# Patient Record
Sex: Female | Born: 1955 | Race: White | Hispanic: No | Marital: Married | Smoking: Never smoker
Health system: Southern US, Community
[De-identification: ages and names within clinical notes are randomized; demographics above are authoritative.]

## PROBLEM LIST (undated history)

## (undated) DIAGNOSIS — Z9889 Other specified postprocedural states: Secondary | ICD-10-CM

## (undated) DIAGNOSIS — I10 Essential (primary) hypertension: Secondary | ICD-10-CM

## (undated) DIAGNOSIS — D649 Anemia, unspecified: Secondary | ICD-10-CM

## (undated) DIAGNOSIS — R112 Nausea with vomiting, unspecified: Secondary | ICD-10-CM

## (undated) DIAGNOSIS — R569 Unspecified convulsions: Secondary | ICD-10-CM

## (undated) DIAGNOSIS — K219 Gastro-esophageal reflux disease without esophagitis: Secondary | ICD-10-CM

## (undated) DIAGNOSIS — E119 Type 2 diabetes mellitus without complications: Secondary | ICD-10-CM

## (undated) HISTORY — PX: CHOLECYSTECTOMY: SHX55

## (undated) HISTORY — PX: TUBAL LIGATION: SHX77

## (undated) HISTORY — PX: BILATERAL CARPAL TUNNEL RELEASE: SHX6508

---

## 2005-07-23 ENCOUNTER — Emergency Department (HOSPITAL_COMMUNITY): Admission: EM | Admit: 2005-07-23 | Discharge: 2005-07-23 | Payer: Self-pay | Admitting: Family Medicine

## 2008-08-17 ENCOUNTER — Emergency Department (HOSPITAL_COMMUNITY): Admission: EM | Admit: 2008-08-17 | Discharge: 2008-08-17 | Payer: Self-pay | Admitting: Emergency Medicine

## 2013-04-01 DIAGNOSIS — Z8614 Personal history of Methicillin resistant Staphylococcus aureus infection: Secondary | ICD-10-CM

## 2013-04-01 DIAGNOSIS — K589 Irritable bowel syndrome without diarrhea: Secondary | ICD-10-CM

## 2013-04-01 DIAGNOSIS — D696 Thrombocytopenia, unspecified: Secondary | ICD-10-CM

## 2013-04-01 DIAGNOSIS — K922 Gastrointestinal hemorrhage, unspecified: Secondary | ICD-10-CM | POA: Insufficient documentation

## 2013-04-01 DIAGNOSIS — K769 Liver disease, unspecified: Secondary | ICD-10-CM

## 2013-04-01 DIAGNOSIS — E119 Type 2 diabetes mellitus without complications: Secondary | ICD-10-CM

## 2013-04-01 DIAGNOSIS — I10 Essential (primary) hypertension: Secondary | ICD-10-CM

## 2013-04-01 HISTORY — DX: Liver disease, unspecified: K76.9

## 2013-04-01 HISTORY — DX: Irritable bowel syndrome without diarrhea: K58.9

## 2013-04-01 HISTORY — DX: Essential (primary) hypertension: I10

## 2013-04-01 HISTORY — DX: Thrombocytopenia, unspecified: D69.6

## 2013-04-01 HISTORY — DX: Type 2 diabetes mellitus without complications: E11.9

## 2013-04-01 HISTORY — DX: Personal history of Methicillin resistant Staphylococcus aureus infection: Z86.14

## 2013-04-01 HISTORY — DX: Gastrointestinal hemorrhage, unspecified: K92.2

## 2013-04-03 DIAGNOSIS — K279 Peptic ulcer, site unspecified, unspecified as acute or chronic, without hemorrhage or perforation: Secondary | ICD-10-CM

## 2013-04-03 HISTORY — DX: Peptic ulcer, site unspecified, unspecified as acute or chronic, without hemorrhage or perforation: K27.9

## 2014-02-19 DIAGNOSIS — Z1331 Encounter for screening for depression: Secondary | ICD-10-CM

## 2014-02-19 HISTORY — DX: Encounter for screening for depression: Z13.31

## 2014-11-19 DIAGNOSIS — Z5320 Procedure and treatment not carried out because of patient's decision for unspecified reasons: Secondary | ICD-10-CM

## 2014-11-19 DIAGNOSIS — Z2821 Immunization not carried out because of patient refusal: Secondary | ICD-10-CM | POA: Insufficient documentation

## 2014-11-19 HISTORY — DX: Immunization not carried out because of patient refusal: Z28.21

## 2014-11-19 HISTORY — DX: Procedure and treatment not carried out because of patient's decision for unspecified reasons: Z53.20

## 2016-03-24 DIAGNOSIS — E6609 Other obesity due to excess calories: Secondary | ICD-10-CM

## 2016-03-24 HISTORY — DX: Other obesity due to excess calories: E66.09

## 2016-09-23 DIAGNOSIS — D693 Immune thrombocytopenic purpura: Secondary | ICD-10-CM

## 2016-09-23 DIAGNOSIS — R161 Splenomegaly, not elsewhere classified: Secondary | ICD-10-CM

## 2016-09-23 DIAGNOSIS — IMO0001 Reserved for inherently not codable concepts without codable children: Secondary | ICD-10-CM

## 2016-09-23 DIAGNOSIS — N841 Polyp of cervix uteri: Secondary | ICD-10-CM

## 2016-09-23 HISTORY — DX: Splenomegaly, not elsewhere classified: R16.1

## 2016-09-23 HISTORY — DX: Polyp of cervix uteri: N84.1

## 2016-09-23 HISTORY — DX: Immune thrombocytopenic purpura: D69.3

## 2016-09-23 HISTORY — DX: Reserved for inherently not codable concepts without codable children: IMO0001

## 2016-10-04 NOTE — H&P (Signed)
Tracy Duke is a 60 y.o. female here for Pre Op Consulting (sign consents) . Patient is here today to sign consents for D&C, hysteroscopy, polypectomy for post-menopausal bleeding  Patient saw hematology on 09/23/2016 with a platelet count of 94. Labs in chart. Workup ongoing with them; she has a hepatic ultrasound scheduled. And will plan to eval spleen.   Past Medical History:  has a past medical history of Diabetes mellitus type 2, uncomplicated (CMS-HCC).  Past Surgical History:  has a past surgical history that includes Cholecystectomy and Toenail excision. Family History: family history includes Breast cancer in her mother; Diabetes mellitus in her paternal grandfather. Social History:  reports that she has never smoked. She has never used smokeless tobacco. She reports that she does not drink alcohol or use illicit drugs. OB/GYN History:  OB History    Gravida Para Term Preterm AB Living   2 2 2   2    SAB TAB Ectopic Multiple Live Births       2      Allergies: is allergic to codeine and penicillins. Medications:  Current Outpatient Prescriptions:  .  blood glucose diagnostic test strip, , Disp: , Rfl:  .  evening primrose oil 500 mg Cap, Take 500 mg by mouth nightly.  , Disp: , Rfl:  .  fluticasone (FLONASE) 50 mcg/actuation nasal spray, Place 2 sprays into both nostrils once daily., Disp: , Rfl:  .  glipiZIDE (GLUCOTROL XL) 5 MG XL tablet, Take 10 mg by mouth once daily.  , Disp: , Rfl: 4 .  LANTUS SOLOSTAR pen injector (concentration 100 units/mL), Inject 55 Units subcutaneously nightly.  , Disp: , Rfl: 10 .  lisinopril (PRINIVIL,ZESTRIL) 20 MG tablet, Take 20 mg by mouth once daily.  , Disp: , Rfl: 10 .  loratadine (CLARITIN) 10 mg capsule, Take 10 mg by mouth once daily., Disp: , Rfl:  .  multivitamin-lutein (MULTIVITAMIN 50 PLUS) tablet, Take 1 tablet by mouth once daily., Disp: , Rfl:  .  pen needle, diabetic 31 gauge x 3/16" needle, , Disp: , Rfl:  .   sitaGLIPtin (JANUVIA) 100 MG tablet, Take 100 mg by mouth once daily.  , Disp: , Rfl:   Review of Systems: No SOB, no palpitations or chest pain, no new lower extremity edema, no nausea or vomiting or bowel or bladder complaints. See HPI for gyn specific ROS.   Exam:      Vitals:   09/28/16 0937  BP: (P) 135/66  Pulse: (P) 81    WDWN white female in NAD Body mass index is 35.09 kg/(m^2) (pended).  General: Patient is well-groomed, well-nourished, appears stated age in no acute distress   Impression:   The primary encounter diagnosis was Post-menopausal bleeding. Diagnoses of Endometrial polyp, Idiopathic thrombocytopenia (CMS-HCC), and Uncontrolled type 2 diabetes mellitus without complication, with long-term current use of insulin (CMS-HCC) were also pertinent to this visit.    Plan:   -  Preoperative visit: D&C hysteroscopy, and polypectomy. Consents signed and dated today.   - Tracy Duke was seen today for pre op consulting.  Diagnoses and all orders for this visit:  Post-menopausal bleeding  Endometrial polyp  Idiopathic thrombocytopenia (CMS-HCC) - following with hematology - no hx of significant bleeding with ob procedures in past - plts high at 94K - will proceed with surgery  Uncontrolled type 2 diabetes mellitus without complication, with long-term current use of insulin (CMS-HCC) - recommended sugar control prior to and following procedure   Risks of surgery  were discussed with the patient including but not limited to: bleeding which may require transfusion; infection which may require antibiotics; injury to uterus or surrounding organs; intrauterine scarring which may impair future fertility; need for additional procedures including laparotomy or laparoscopy; and other postoperative/anesthesia complications. Written informed consent was obtained.  This is a scheduled same-day surgery. She will have a postop visit in 2 weeks to review  operative findings and pathology.  -  Return in about 4 weeks (around 10/26/2016) for Postop check.  Cecilie KicksBETHANY EVANGELINE Omran Keelin, MD

## 2016-10-07 ENCOUNTER — Encounter
Admission: RE | Admit: 2016-10-07 | Discharge: 2016-10-07 | Disposition: A | Discharge status: Home / Self Care | Payer: BLUE CROSS/BLUE SHIELD | Source: Ambulatory Visit | Attending: Obstetrics and Gynecology | Admitting: Obstetrics and Gynecology

## 2016-10-07 DIAGNOSIS — Z01818 Encounter for other preprocedural examination: Secondary | ICD-10-CM | POA: Insufficient documentation

## 2016-10-07 HISTORY — DX: Essential (primary) hypertension: I10

## 2016-10-07 HISTORY — DX: Gastro-esophageal reflux disease without esophagitis: K21.9

## 2016-10-07 HISTORY — DX: Anemia, unspecified: D64.9

## 2016-10-07 HISTORY — DX: Unspecified convulsions: R56.9

## 2016-10-07 HISTORY — DX: Type 2 diabetes mellitus without complications: E11.9

## 2016-10-07 HISTORY — DX: Other specified postprocedural states: Z98.890

## 2016-10-07 HISTORY — DX: Nausea with vomiting, unspecified: R11.2

## 2016-10-07 LAB — CBC
HCT: 39.7 % (ref 35.0–47.0)
Hemoglobin: 13.9 g/dL (ref 12.0–16.0)
MCH: 31.8 pg (ref 26.0–34.0)
MCHC: 35.1 g/dL (ref 32.0–36.0)
MCV: 90.6 fL (ref 80.0–100.0)
PLATELETS: 76 10*3/uL — AB (ref 150–440)
RBC: 4.38 MIL/uL (ref 3.80–5.20)
RDW: 13.3 % (ref 11.5–14.5)
WBC: 3.9 10*3/uL (ref 3.6–11.0)

## 2016-10-07 LAB — TYPE AND SCREEN
ABO/RH(D): O POS
ANTIBODY SCREEN: NEGATIVE

## 2016-10-07 LAB — BASIC METABOLIC PANEL
Anion gap: 7 (ref 5–15)
BUN: 20 mg/dL (ref 6–20)
CALCIUM: 9.3 mg/dL (ref 8.9–10.3)
CO2: 27 mmol/L (ref 22–32)
CREATININE: 0.86 mg/dL (ref 0.44–1.00)
Chloride: 104 mmol/L (ref 101–111)
GLUCOSE: 301 mg/dL — AB (ref 65–99)
Potassium: 4.3 mmol/L (ref 3.5–5.1)
Sodium: 138 mmol/L (ref 135–145)

## 2016-10-07 LAB — SURGICAL PCR SCREEN
MRSA, PCR: NEGATIVE
Staphylococcus aureus: NEGATIVE

## 2016-10-07 NOTE — Patient Instructions (Signed)
  Your procedure is scheduled on:October 15, 2016 (Friday) Report to [facility] Day [facility]nd [facility] To find out your arrival time please call [phone] between 1PM - 3PM on October 14, 2016 (Thursday)  Remember: Instructions that are not followed completely may result in serious medical risk, up to and including death, or upon the discretion of your surgeon and anesthesiologist your surgery may need to be rescheduled.    _x___ 1. Do not eat food or drink liquids after midnight. No gum chewing or hard candies.     __x__ 2. No Alcohol for 24 hours before or after surgery.   __x__3. No Smoking for 24 prior to surgery.   ____  4. Bring all medications with you on the day of surgery if instructed.    __x__ 5. Notify your doctor if there is any change in your medical condition     (cold, fever, infections).     Do not wear jewelry, make-up, hairpins, clips or nail polish.  Do not wear lotions, powders, or perfumes. You may wear deodorant.  Do not shave 48 hours prior to surgery. Men may shave face and neck.  Do not bring valuables to the hospital.    [facility] is not responsible for any belongings or valuables.               Contacts, dentures or bridgework may not be worn into surgery.  Leave your suitcase in the car. After surgery it may be brought to your room.  For patients admitted to the hospital, discharge time is determined by your treatment team.   Patients discharged the day of surgery will not be allowed to drive home.    Please read over the following fact sheets that you were given:   [facility] Preparing for Surgery and or MRSA Information   _x___ Take these medicines the morning of surgery with A SIP OF WATER:    1. Lisinopril  2.  3.  4.  5.  6.  ____Fleets enema or Magnesium Citrate as directed.   _x___ Use CHG Soap or sage wipes as directed on instruction sheet   ____ Use inhalers on the day of surgery and bring to hospital day of  surgery  ____ Stop metformin 2 days prior to surgery    __x__ Take 1/2 of usual insulin dose the night before surgery and none on the morning of surgery.   (TAKE ONE-HALF OF LANTUS ON THURSDAY NIGHT   PRIOR TO SURGERY, AND NO INSULIN THE MORNING OF SURGERY  __x__ Stop aspirin or coumadin, or plavix (NO ASPIRIN)  x__ Stop Anti-inflammatories such as Advil, Aleve, Ibuprofen, Motrin, Naproxen,          Naprosyn, Goodies powders or aspirin products. Ok to take Tylenol.   _x___ Stop supplements until after surgery.  (Stop Glucosamine, Alive, and Primrose now)  ____ Bring C-Pap to the hospital.

## 2016-10-12 NOTE — H&P (Signed)
Ms.Crosbyis a 60 y.o.femalehere for Pre Op Consulting (sign consents) . Patient is here today to sign consents for D&C, hysteroscopy, polypectomy for post-menopausal bleeding  Patient saw hematology on 09/23/2016 with a platelet count of 94. Labs in chart. Workup ongoing with them; she has a hepatic ultrasound scheduled. And will plan to eval spleen.   Past Medical History:has a past medical history of Diabetes mellitus type 2, uncomplicated (CMS-HCC). Past Surgical History:has a past surgical history that includes Cholecystectomy and Toenail excision. Family History:family history includes Breast cancer in her mother; Diabetes mellitus in her paternal grandfather. Social History:reports that she has never smoked. She has never used smokeless tobacco. She reports that she does not drink alcohol or use illicit drugs. OB/GYN History:         OB History   Gravida Para Term Preterm AB Living   2 2 2   2    SAB TAB Ectopic Multiple Live Births       2      Allergies:is allergic to codeine and penicillins. Medications:  Current Outpatient Prescriptions:  .blood glucose diagnostic test strip, , Disp: , Rfl:  .evening primrose oil 500 mg Cap, Take 500 mg by mouth nightly. , Disp: , Rfl:  .fluticasone (FLONASE) 50 mcg/actuation nasal spray, Place 2 sprays into both nostrils once daily., Disp: , Rfl:  .glipiZIDE (GLUCOTROL XL) 5 MG XL tablet, Take 10 mg by mouth once daily. , Disp: , Rfl: 4 .LANTUS SOLOSTAR pen injector (concentration 100 units/mL), Inject 55 Units subcutaneously nightly. , Disp: , Rfl: 10 .lisinopril (PRINIVIL,ZESTRIL) 20 MG tablet, Take 20 mg by mouth once daily. , Disp: , Rfl: 10 .loratadine (CLARITIN) 10 mg capsule, Take 10 mg by mouth once daily., Disp: , Rfl:  .multivitamin-lutein (MULTIVITAMIN 50 PLUS) tablet, Take 1 tablet by mouth once daily., Disp: , Rfl:  .pen needle, diabetic 31 gauge x 3/16" needle, , Disp: ,  Rfl:  .sitaGLIPtin (JANUVIA) 100 MG tablet, Take 100 mg by mouth once daily. , Disp: , Rfl:   Review of Systems: No SOB, no palpitations or chest pain, no new lower extremity edema, no nausea or vomiting or bowel or bladder complaints. See HPI for gyn specific ROS.  Exam:      Vitals:   09/28/16 0937  BP: (P) 135/66  Pulse: (P) 81    WDWN whitefemale in NADBody mass index is 35.09 kg/(m^2) (pended).  General: Patient is well-groomed, well-nourished, appears stated age in no acute distress  HEENT: head is atraumatic and normocephalic, trachea is midline, neck is supple with no palpable nodules  CV: Regular rhythm and normal heart rate, no murmur  Pulm: Clear to auscultation throughout lung fields with no wheezing, crackles, or rhonchi. No increased work of breathing  Abdomen: soft , no mass, non-tender, no rebound tenderness, no hepatomegaly  Pelvic:  Deferred this visit   Impression:   The primary encounter diagnosis was Post-menopausal bleeding. Diagnoses of Endometrial polyp, Idiopathic thrombocytopenia (CMS-HCC), and Uncontrolled type 2 diabetes mellitus without complication, with long-term current use of insulin (CMS-HCC) were also pertinent to this visit.    Plan:   -Preoperative visit: D&C hysteroscopy, and polypectomy. Consents signed and datedtoday.   - Tracy Duke was seen today for pre op consulting.  Diagnoses and all orders for this visit:  Post-menopausal bleeding  Endometrial polyp  Idiopathic thrombocytopenia (CMS-HCC) - following with hematology - no hx of significant bleeding with ob procedures in past - plts high at 94K - will proceed with surgery  Uncontrolled type  2 diabetes mellitus without complication, with long-term current use of insulin (CMS-HCC) - recommended sugar control prior to and following procedure   Risks of surgery were discussed with the patient including but not limited to: bleeding  which may require transfusion; infection which may require antibiotics; injury to uterus or surrounding organs; intrauterine scarring which may impair future fertility; need for additional procedures including laparotomy or laparoscopy; and other postoperative/anesthesia complications. Written informed consent was obtained.  This is a scheduled same-day surgery. She will have a postop visit in 2 weeks to review operative findings and pathology.  - Return in about 4 weeks (around 10/26/2016) for Postop check.  Tracy KicksBETHANY EVANGELINE Trevis Eden, MD

## 2016-10-15 ENCOUNTER — Encounter
Admission: RE | Disposition: A | Discharge status: Home / Self Care | Payer: Self-pay | Source: Ambulatory Visit | Attending: Obstetrics and Gynecology

## 2016-10-15 ENCOUNTER — Ambulatory Visit: Payer: BLUE CROSS/BLUE SHIELD | Admitting: Anesthesiology

## 2016-10-15 ENCOUNTER — Ambulatory Visit
Admission: RE | Admit: 2016-10-15 | Discharge: 2016-10-15 | Disposition: A | Discharge status: Home / Self Care | Payer: BLUE CROSS/BLUE SHIELD | Source: Ambulatory Visit | Attending: Obstetrics and Gynecology | Admitting: Obstetrics and Gynecology

## 2016-10-15 DIAGNOSIS — D693 Immune thrombocytopenic purpura: Secondary | ICD-10-CM | POA: Insufficient documentation

## 2016-10-15 DIAGNOSIS — Z6835 Body mass index (BMI) 35.0-35.9, adult: Secondary | ICD-10-CM | POA: Insufficient documentation

## 2016-10-15 DIAGNOSIS — E669 Obesity, unspecified: Secondary | ICD-10-CM | POA: Insufficient documentation

## 2016-10-15 DIAGNOSIS — N950 Postmenopausal bleeding: Secondary | ICD-10-CM | POA: Diagnosis not present

## 2016-10-15 DIAGNOSIS — K219 Gastro-esophageal reflux disease without esophagitis: Secondary | ICD-10-CM | POA: Insufficient documentation

## 2016-10-15 DIAGNOSIS — I10 Essential (primary) hypertension: Secondary | ICD-10-CM | POA: Diagnosis not present

## 2016-10-15 DIAGNOSIS — N840 Polyp of corpus uteri: Secondary | ICD-10-CM | POA: Insufficient documentation

## 2016-10-15 DIAGNOSIS — Z880 Allergy status to penicillin: Secondary | ICD-10-CM | POA: Insufficient documentation

## 2016-10-15 DIAGNOSIS — Z885 Allergy status to narcotic agent status: Secondary | ICD-10-CM | POA: Insufficient documentation

## 2016-10-15 DIAGNOSIS — Z794 Long term (current) use of insulin: Secondary | ICD-10-CM | POA: Diagnosis not present

## 2016-10-15 DIAGNOSIS — E1165 Type 2 diabetes mellitus with hyperglycemia: Secondary | ICD-10-CM | POA: Insufficient documentation

## 2016-10-15 DIAGNOSIS — D649 Anemia, unspecified: Secondary | ICD-10-CM | POA: Insufficient documentation

## 2016-10-15 HISTORY — PX: HYSTEROSCOPY W/D&C: SHX1775

## 2016-10-15 LAB — ABO/RH: ABO/RH(D): O POS

## 2016-10-15 LAB — GLUCOSE, CAPILLARY
GLUCOSE-CAPILLARY: 269 mg/dL — AB (ref 65–99)
GLUCOSE-CAPILLARY: 205 mg/dL — AB (ref 65–99)

## 2016-10-15 SURGERY — DILATATION AND CURETTAGE /HYSTEROSCOPY
Anesthesia: General | Wound class: Clean Contaminated

## 2016-10-15 MED ORDER — ONDANSETRON HCL 4 MG/2ML IJ SOLN
INTRAMUSCULAR | Status: DC | PRN
  Administered 2016-10-15: 4 mg via INTRAVENOUS

## 2016-10-15 MED ORDER — FENTANYL CITRATE (PF) 100 MCG/2ML IJ SOLN
INTRAMUSCULAR | Status: DC | PRN
  Administered 2016-10-15 (×2): 50 ug via INTRAVENOUS

## 2016-10-15 MED ORDER — LIDOCAINE HCL (CARDIAC) 20 MG/ML IV SOLN
INTRAVENOUS | Status: DC | PRN
  Administered 2016-10-15: 40 mg via INTRAVENOUS

## 2016-10-15 MED ORDER — ESTROGENS, CONJUGATED 0.625 MG/GM VA CREA
TOPICAL_CREAM | VAGINAL | Status: AC
  Filled 2016-10-15: qty 30

## 2016-10-15 MED ORDER — PROPOFOL 10 MG/ML IV BOLUS
INTRAVENOUS | Status: DC | PRN
  Administered 2016-10-15: 160 mg via INTRAVENOUS

## 2016-10-15 MED ORDER — FAMOTIDINE 20 MG PO TABS
ORAL_TABLET | ORAL | Status: AC
  Administered 2016-10-15: 20 mg via ORAL
  Filled 2016-10-15: qty 1

## 2016-10-15 MED ORDER — ACETAMINOPHEN 10 MG/ML IV SOLN
INTRAVENOUS | Status: AC
  Filled 2016-10-15: qty 100

## 2016-10-15 MED ORDER — SUGAMMADEX SODIUM 200 MG/2ML IV SOLN
INTRAVENOUS | Status: DC | PRN
  Administered 2016-10-15: 200 mg via INTRAVENOUS

## 2016-10-15 MED ORDER — ROCURONIUM BROMIDE 100 MG/10ML IV SOLN
INTRAVENOUS | Status: DC | PRN
  Administered 2016-10-15: 50 mg via INTRAVENOUS

## 2016-10-15 MED ORDER — ACETAMINOPHEN 10 MG/ML IV SOLN
INTRAVENOUS | Status: DC | PRN
  Administered 2016-10-15: 1000 mg via INTRAVENOUS

## 2016-10-15 MED ORDER — OXYCODONE HCL 5 MG/5ML PO SOLN: 5.0000 mg | Freq: Once | ORAL | Status: DC | PRN

## 2016-10-15 MED ORDER — OXYCODONE HCL 5 MG PO TABS: 5.0000 mg | ORAL_TABLET | Freq: Once | ORAL | Status: DC | PRN

## 2016-10-15 MED ORDER — ONDANSETRON 4 MG PO TBDP: 4.0000 mg | ORAL_TABLET | Freq: Three times a day (TID) | ORAL | 0 refills | Status: DC | PRN

## 2016-10-15 MED ORDER — SCOPOLAMINE 1 MG/3DAYS TD PT72
MEDICATED_PATCH | TRANSDERMAL | Status: AC
  Filled 2016-10-15: qty 1

## 2016-10-15 MED ORDER — FAMOTIDINE 20 MG PO TABS
20.0000 mg | ORAL_TABLET | Freq: Once | ORAL | Status: AC
  Administered 2016-10-15: 20 mg via ORAL

## 2016-10-15 MED ORDER — PHENYLEPHRINE HCL 10 MG/ML IJ SOLN
INTRAMUSCULAR | Status: DC | PRN
  Administered 2016-10-15: 100 ug via INTRAVENOUS

## 2016-10-15 MED ORDER — SODIUM CHLORIDE 0.9 % IV SOLN
INTRAVENOUS | Status: DC
  Administered 2016-10-15: 10:00:00 via INTRAVENOUS

## 2016-10-15 MED ORDER — SCOPOLAMINE 1 MG/3DAYS TD PT72
1.0000 | MEDICATED_PATCH | TRANSDERMAL | Status: DC
  Administered 2016-10-15: 1.5 mg via TRANSDERMAL

## 2016-10-15 MED ORDER — DOCUSATE SODIUM 100 MG PO CAPS: 100.0000 mg | ORAL_CAPSULE | Freq: Every day | ORAL | 3 refills | Status: DC | PRN

## 2016-10-15 MED ORDER — MIDAZOLAM HCL 2 MG/2ML IJ SOLN
INTRAMUSCULAR | Status: DC | PRN
  Administered 2016-10-15: 2 mg via INTRAVENOUS

## 2016-10-15 MED ORDER — IBUPROFEN 800 MG PO TABS: 800.0000 mg | ORAL_TABLET | Freq: Three times a day (TID) | ORAL | 0 refills | Status: AC | PRN

## 2016-10-15 MED ORDER — MEPERIDINE HCL 25 MG/ML IJ SOLN: 6.2500 mg | INTRAMUSCULAR | Status: DC | PRN

## 2016-10-15 MED ORDER — FENTANYL CITRATE (PF) 100 MCG/2ML IJ SOLN: 25.0000 ug | INTRAMUSCULAR | Status: DC | PRN

## 2016-10-15 MED ORDER — ESTROGENS, CONJUGATED 0.625 MG/GM VA CREA
TOPICAL_CREAM | VAGINAL | Status: DC | PRN
  Administered 2016-10-15: 1 via VAGINAL

## 2016-10-15 MED ORDER — SILVER NITRATE-POT NITRATE 75-25 % EX MISC
CUTANEOUS | Status: AC
  Filled 2016-10-15: qty 4

## 2016-10-15 MED ORDER — PROMETHAZINE HCL 25 MG/ML IJ SOLN: 6.2500 mg | INTRAMUSCULAR | Status: DC | PRN

## 2016-10-15 SURGICAL SUPPLY — 20 items
BAG INFUSER PRESSURE 100CC (MISCELLANEOUS) ×3 IMPLANT
CANISTER SUCT 3000ML (MISCELLANEOUS) ×3 IMPLANT
CATH ROBINSON RED A/P 16FR (CATHETERS) ×3 IMPLANT
CORD URO TURP 10FT (MISCELLANEOUS) IMPLANT
ELECT LOOP MED HF 24F 12D (CUTTING LOOP) IMPLANT
ELECT REM PT RETURN 9FT ADLT (ELECTROSURGICAL) ×3
ELECT RESECT POWERBALL 24F (MISCELLANEOUS) IMPLANT
ELECTRODE REM PT RTRN 9FT ADLT (ELECTROSURGICAL) ×1 IMPLANT
GLOVE BIO SURGEON STRL SZ 6.5 (GLOVE) ×8 IMPLANT
GLOVE BIO SURGEONS STRL SZ 6.5 (GLOVE) ×4
GLOVE INDICATOR 7.0 STRL GRN (GLOVE) ×12 IMPLANT
GOWN STRL REUS W/ TWL LRG LVL3 (GOWN DISPOSABLE) ×2 IMPLANT
GOWN STRL REUS W/TWL LRG LVL3 (GOWN DISPOSABLE) ×6
IV LACTATED RINGERS 1000ML (IV SOLUTION) ×3 IMPLANT
KIT RM TURNOVER CYSTO AR (KITS) ×3 IMPLANT
PACK DNC HYST (MISCELLANEOUS) ×3 IMPLANT
PAD OB MATERNITY 4.3X12.25 (PERSONAL CARE ITEMS) ×3 IMPLANT
PAD PREP 24X41 OB/GYN DISP (PERSONAL CARE ITEMS) ×3 IMPLANT
TUBING CONNECTING 10 (TUBING) ×2 IMPLANT
TUBING CONNECTING 10' (TUBING) ×1

## 2016-10-15 NOTE — Anesthesia Preprocedure Evaluation (Signed)
Anesthesia Evaluation  Patient identified by MRN, date of birth, ID band Patient awake    Reviewed: Allergy & Precautions, NPO status , Patient's Chart, lab work & pertinent test results  History of Anesthesia Complications (+) PONV and history of anesthetic complications  Airway Mallampati: II  TM Distance: >3 FB Neck ROM: Full    Dental  (+) Missing, Partial Lower   Pulmonary neg pulmonary ROS, neg sleep apnea, neg COPD,    breath sounds clear to auscultation- rhonchi (-) wheezing      Cardiovascular Exercise Tolerance: Good hypertension, Pt. on medications (-) CAD and (-) Past MI  Rhythm:Regular Rate:Normal - Systolic murmurs and - Diastolic murmurs    Neuro/Psych Seizures: remote hx of seizure in setting of sleep deprivation.  negative psych ROS   GI/Hepatic Neg liver ROS, GERD  ,  Endo/Other  diabetes, Type 2, Oral Hypoglycemic Agents, Insulin Dependent  Renal/GU negative Renal ROS     Musculoskeletal negative musculoskeletal ROS (+)   Abdominal (+) + obese,   Peds  Hematology  (+) anemia ,   Anesthesia Other Findings Past Medical History: No date: Anemia No date: Diabetes mellitus without complication (HCC) No date: GERD (gastroesophageal reflux disease) No date: Hypertension No date: PONV (postoperative nausea and vomiting) No date: Seizures (HCC)     Comment: History of seizures twenty years ago   Reproductive/Obstetrics                             Anesthesia Physical Anesthesia Plan  ASA: III  Anesthesia Plan: General   Post-op Pain Management:    Induction: Intravenous  Airway Management Planned: Oral ETT  Additional Equipment:   Intra-op Plan:   Post-operative Plan:   Informed Consent: I have reviewed the patients History and Physical, chart, labs and discussed the procedure including the risks, benefits and alternatives for the proposed anesthesia with the  patient or authorized representative who has indicated his/her understanding and acceptance.   Dental advisory given  Plan Discussed with: CRNA and Anesthesiologist  Anesthesia Plan Comments:         Anesthesia Quick Evaluation

## 2016-10-15 NOTE — Discharge Instructions (Signed)
AMBULATORY SURGERY  DISCHARGE INSTRUCTIONS   1) The drugs that you were given will stay in your system until tomorrow so for the next 24 hours you should not:  A) Drive an automobile B) Make any legal decisions C) Drink any alcoholic beverage   2) You may resume regular meals tomorrow.  Today it is better to start with liquids and gradually work up to solid foods.  You may eat anything you prefer, but it is better to start with liquids, then soup and crackers, and gradually work up to solid foods.   3) Please notify your doctor immediately if you have any unusual bleeding, trouble breathing, redness and pain at the surgery site, drainage, fever, or pain not relieved by medication.    4) Additional Instructions:        Please contact your physician with any problems or Same Day Surgery at [phone], Monday through Friday 6 am to 4 pm, or [place] at [facility] Main number at [phone].  Use Premarin cream one applicator nightly.

## 2016-10-15 NOTE — Op Note (Addendum)
Operative Report Hysteroscopy with Dilation and Curettage; polypectomy   Indications: Postmenopausal bleeding  Pre-operative Diagnosis:  1. Endometrial polyps 2. Poorly controlled diabetes - preoperative glucose 269 3. Idiopathic thrombocytopenia - preoperative platelets 76  Post-operative Diagnosis: same.  Procedure: 1. Exam under anesthesia 2. Fractional D&C 3. Hysteroscopy 4. Endometrial polypectomy using the Myosure device  Surgeon: Christeen Douglas, MD  Assistant(s):  None  Anesthesia: General endotracheal anesthesia  Anesthesiologist: Alver Fisher, MD Anesthesiologist: Alver Fisher, MD CRNA: Marlana Salvage, CRNA  Estimated Blood Loss:  25 ml         Intraoperative medications: IV Tylenol         Total IV Fluids: 1000 ml  Urine Output: 50 ml  Total Fluid Deficit:  250 mL          Specimens: Endocervical curettings, endometrial curettings, endometrial polyps         Complications:  None; patient tolerated the procedure well.         Disposition: PACU - hemodynamically stable.         Condition: stable  Findings: Uterus measuring 11 cm by sound; normal cervix, and perineum. Diffusely irritated appearing vaginal mucosa, with multiple sites of oozing after the prep. Proliferative appearing endometrium, with 4 large endometrial polyps. She will be given estrogen cream to use during her postoperative healing period.  Indication for procedure/Consents: 60 y.o. here for scheduled surgery for the aforementioned diagnoses.   Risks of surgery were discussed with the patient including but not limited to: bleeding which may require transfusion; infection which may require antibiotics; injury to uterus or surrounding organs; intrauterine scarring which may impair future fertility; need for additional procedures including laparotomy or laparoscopy; and other postoperative/anesthesia complications. Written informed consent was obtained.    Procedure Details:   D&C/ Myosure   The patient was taken to the operating room where anesthesia was administered and was found to be adequate. After a formal and adequate timeout was performed, she was placed in the dorsal lithotomy position and examined with the above findings. She was then prepped and draped in the sterile manner. Her bladder was catheterized for an estimated amount of clear, yellow urine. A weighed speculum was then placed in the patient's vagina and a single tooth tenaculum was applied to the anterior lip of the cervix.  Her cervix was serially dilated to 15 [place] using Hanks dilators. An ECC was performed. The hysteroscope was introduced under direct observation  Using lactated ringers as a distention medium to reveal the above findings. The uterine cavity was carefully examined, both ostia were recognized, and diffusely proliferative endometrium with the polyps above were noted.   This was resected using the Myosure device.  After further careful visualization of the uterine cavity, the hysteroscope was removed under direct visualization.  A sharp curettage was then performed until there was a gritty texture in all four quadrants. The tenaculum was removed from the anterior lip of the cervix and the vaginal speculum was removed after applying silver nitrate for good hemostasis. Estrogen cream was used to coat the vaginal mucosa and ectocervix. She will be sent home with this cream.  The patient tolerated the procedure well and was taken to the recovery area awake and in stable condition. She received iv acetaminophen prior to leaving the OR. She was not given Toradol in the OR because of her platelets.  The patient will be discharged to home as per PACU criteria. Routine postoperative instructions given. She was prescribed Ibuprofen and Colace. She will  follow up in the clinic in two weeks for postoperative evaluation.

## 2016-10-15 NOTE — Interval H&P Note (Signed)
History and Physical Interval Note:  10/15/2016 9:35 AM  Tracy PlumeSandra K Duke  has presented today for surgery, with the diagnosis of PMB, Endometrial Polyp  The various methods of treatment have been discussed with the patient and family. After consideration of risks, benefits and other options for treatment, the patient has consented to  Procedure(s): DILATATION AND CURETTAGE /HYSTEROSCOPY (N/A) as a surgical intervention .  The patient's history has been reviewed, patient examined, no change in status, stable for surgery.  I have reviewed the patient's chart and labs.  Questions were answered to the patient's satisfaction.    Blood glucose was high this morning, as expected. Because of low risk of infection and no skin incisions, will not lower it prior to surgery.  Christeen DouglasBEASLEY, Lamount Bankson

## 2016-10-15 NOTE — Anesthesia Postprocedure Evaluation (Signed)
Anesthesia Post Note  Patient: Tracy Duke  Procedure(s) Performed: Procedure(s) (LRB): DILATATION AND CURETTAGE /HYSTEROSCOPY/POLYPECTOMY (N/A)  Patient location during evaluation: PACU Anesthesia Type: General Level of consciousness: awake and alert and oriented Pain management: pain level controlled Vital Signs Assessment: post-procedure vital signs reviewed and stable Respiratory status: spontaneous breathing, nonlabored ventilation and respiratory function stable Cardiovascular status: blood pressure returned to baseline and stable Postop Assessment: no signs of nausea or vomiting Anesthetic complications: no    Last Vitals:  Vitals:   10/15/16 1346 10/15/16 1400  BP:  (!) 130/48  Pulse:  65  Resp:  18  Temp: 36.4 C 36.5 C    Last Pain:  Vitals:   10/15/16 1400  TempSrc:   PainSc: 2                  Kambre Messner

## 2016-10-15 NOTE — Transfer of Care (Signed)
Immediate Anesthesia Transfer of Care Note  Patient: Tracy PlumeSandra K Konopka  Procedure(s) Performed: Procedure(s): DILATATION AND CURETTAGE /HYSTEROSCOPY (N/A)  Patient Location: PACU  Anesthesia Type:General  Level of Consciousness: awake, oriented and patient cooperative  Airway & Oxygen Therapy: Patient Spontanous Breathing and Patient connected to nasal cannula oxygen  Post-op Assessment: Report given to RN and Post -op Vital signs reviewed and stable  Post vital signs: Reviewed and stable  Last Vitals:  Vitals:   10/15/16 0914 10/15/16 1316  BP: (!) 142/63 (!) 149/64  Pulse: 80 89  Resp: 16 16  Temp: 36.6 C 36.3 C    Last Pain:  Vitals:   10/15/16 0914  TempSrc: Tympanic         Complications: No apparent anesthesia complications

## 2016-10-15 NOTE — Anesthesia Procedure Notes (Signed)
Procedure Name: Intubation Date/Time: 10/15/2016 12:22 PM Performed by: Marlana SalvageJESSUP, Kaylina Pre-anesthesia Checklist: Patient identified, Emergency Drugs available, Suction available, Patient being monitored and Timeout performed Patient Re-evaluated:Patient Re-evaluated prior to inductionOxygen Delivery Method: Circle system utilized Preoxygenation: Pre-oxygenation with 100% oxygen Intubation Type: IV induction Ventilation: Mask ventilation without difficulty Laryngoscope Size: Mac and 3 Grade View: Grade I Tube type: Oral Tube size: 7.0 mm Number of attempts: 1 Airway Equipment and Method: Stylet Placement Confirmation: ETT inserted through vocal cords under direct vision,  positive ETCO2 and breath sounds checked- equal and bilateral Secured at: 20 cm Tube secured with: Tape Dental Injury: Teeth and Oropharynx as per pre-operative assessment

## 2016-10-18 ENCOUNTER — Encounter: Payer: Self-pay | Admitting: Obstetrics and Gynecology

## 2016-10-18 LAB — SURGICAL PATHOLOGY

## 2020-03-29 ENCOUNTER — Inpatient Hospital Stay (HOSPITAL_COMMUNITY)
Admission: AD | Admit: 2020-03-29 | Discharge: 2020-03-31 | DRG: 312 | Discharge status: Against Medical Advice | Payer: Medicare PPO | Source: Other Acute Inpatient Hospital | Attending: Internal Medicine | Admitting: Internal Medicine

## 2020-03-29 ENCOUNTER — Other Ambulatory Visit: Payer: Self-pay

## 2020-03-29 ENCOUNTER — Observation Stay (HOSPITAL_COMMUNITY): Payer: Medicare PPO

## 2020-03-29 DIAGNOSIS — Z91010 Allergy to peanuts: Secondary | ICD-10-CM | POA: Diagnosis not present

## 2020-03-29 DIAGNOSIS — K7581 Nonalcoholic steatohepatitis (NASH): Secondary | ICD-10-CM | POA: Diagnosis present

## 2020-03-29 DIAGNOSIS — K219 Gastro-esophageal reflux disease without esophagitis: Secondary | ICD-10-CM | POA: Diagnosis not present

## 2020-03-29 DIAGNOSIS — Z5329 Procedure and treatment not carried out because of patient's decision for other reasons: Secondary | ICD-10-CM | POA: Diagnosis present

## 2020-03-29 DIAGNOSIS — E119 Type 2 diabetes mellitus without complications: Secondary | ICD-10-CM | POA: Diagnosis not present

## 2020-03-29 DIAGNOSIS — I5032 Chronic diastolic (congestive) heart failure: Secondary | ICD-10-CM | POA: Diagnosis not present

## 2020-03-29 DIAGNOSIS — Z803 Family history of malignant neoplasm of breast: Secondary | ICD-10-CM | POA: Diagnosis not present

## 2020-03-29 DIAGNOSIS — Z79899 Other long term (current) drug therapy: Secondary | ICD-10-CM

## 2020-03-29 DIAGNOSIS — Z794 Long term (current) use of insulin: Secondary | ICD-10-CM | POA: Diagnosis not present

## 2020-03-29 DIAGNOSIS — Z885 Allergy status to narcotic agent status: Secondary | ICD-10-CM

## 2020-03-29 DIAGNOSIS — I214 Non-ST elevation (NSTEMI) myocardial infarction: Secondary | ICD-10-CM | POA: Diagnosis not present

## 2020-03-29 DIAGNOSIS — I4510 Unspecified right bundle-branch block: Secondary | ICD-10-CM | POA: Diagnosis not present

## 2020-03-29 DIAGNOSIS — R55 Syncope and collapse: Principal | ICD-10-CM | POA: Diagnosis present

## 2020-03-29 DIAGNOSIS — R7989 Other specified abnormal findings of blood chemistry: Secondary | ICD-10-CM | POA: Diagnosis not present

## 2020-03-29 DIAGNOSIS — R778 Other specified abnormalities of plasma proteins: Secondary | ICD-10-CM | POA: Diagnosis not present

## 2020-03-29 DIAGNOSIS — I110 Hypertensive heart disease with heart failure: Secondary | ICD-10-CM | POA: Diagnosis present

## 2020-03-29 DIAGNOSIS — Z20822 Contact with and (suspected) exposure to covid-19: Secondary | ICD-10-CM | POA: Diagnosis present

## 2020-03-29 DIAGNOSIS — K7460 Unspecified cirrhosis of liver: Secondary | ICD-10-CM

## 2020-03-29 HISTORY — DX: Syncope and collapse: R55

## 2020-03-29 LAB — BASIC METABOLIC PANEL
Anion gap: 10 (ref 5–15)
BUN: 22 mg/dL (ref 8–23)
CO2: 22 mmol/L (ref 22–32)
Calcium: 8.6 mg/dL — ABNORMAL LOW (ref 8.9–10.3)
Chloride: 97 mmol/L — ABNORMAL LOW (ref 98–111)
Creatinine, Ser: 1.01 mg/dL — ABNORMAL HIGH (ref 0.44–1.00)
GFR calc Af Amer: >60 mL/min (ref >60)
GFR calc non Af Amer: 58 mL/min — ABNORMAL LOW (ref >60)
Glucose, Bld: 417 mg/dL — ABNORMAL HIGH (ref 70–99)
Potassium: 4.8 mmol/L (ref 3.5–5.1)
Sodium: 129 mmol/L — ABNORMAL LOW (ref 135–145)

## 2020-03-29 LAB — CBC
HCT: 33.2 % — ABNORMAL LOW (ref 36.0–46.0)
Hemoglobin: 11.1 g/dL — ABNORMAL LOW (ref 12.0–15.0)
MCH: 30.3 pg (ref 26.0–34.0)
MCHC: 33.4 g/dL (ref 30.0–36.0)
MCV: 90.7 fL (ref 80.0–100.0)
Platelets: 117 10*3/uL — ABNORMAL LOW (ref 150–400)
RBC: 3.66 MIL/uL — ABNORMAL LOW (ref 3.87–5.11)
RDW: 13.8 % (ref 11.5–15.5)
WBC: 8.7 10*3/uL (ref 4.0–10.5)
nRBC: 0 % (ref 0.0–0.2)

## 2020-03-29 LAB — HEPATIC FUNCTION PANEL
ALT: 22 U/L (ref 0–44)
AST: 28 U/L (ref 15–41)
Albumin: 2.6 g/dL — ABNORMAL LOW (ref 3.5–5.0)
Alkaline Phosphatase: 86 U/L (ref 38–126)
Bilirubin, Direct: 0.4 mg/dL — ABNORMAL HIGH (ref 0.0–0.2)
Indirect Bilirubin: 0.8 mg/dL (ref 0.3–0.9)
Total Bilirubin: 1.2 mg/dL (ref 0.3–1.2)
Total Protein: 6.3 g/dL — ABNORMAL LOW (ref 6.5–8.1)

## 2020-03-29 LAB — FERRITIN: Ferritin: 231 ng/mL (ref 11–307)

## 2020-03-29 LAB — IRON AND TIBC
Iron: 29 ug/dL (ref 28–170)
Saturation Ratios: 11 % (ref 10.4–31.8)
TIBC: 269 ug/dL (ref 250–450)
UIBC: 240 ug/dL

## 2020-03-29 LAB — PROTIME-INR
INR: 1.5 — ABNORMAL HIGH (ref 0.8–1.2)
Prothrombin Time: 17.9 seconds — ABNORMAL HIGH (ref 11.4–15.2)

## 2020-03-29 LAB — GLUCOSE, CAPILLARY
Glucose-Capillary: 385 mg/dL — ABNORMAL HIGH (ref 70–99)
Glucose-Capillary: 353 mg/dL — ABNORMAL HIGH (ref 70–99)

## 2020-03-29 LAB — HIV ANTIBODY (ROUTINE TESTING W REFLEX): HIV Screen 4th Generation wRfx: NONREACTIVE

## 2020-03-29 LAB — TROPONIN I (HIGH SENSITIVITY)
Troponin I (High Sensitivity): 261 ng/L (ref <18)
Troponin I (High Sensitivity): 249 ng/L (ref <18)

## 2020-03-29 LAB — MAGNESIUM: Magnesium: 1.9 mg/dL (ref 1.7–2.4)

## 2020-03-29 MED ORDER — ENOXAPARIN SODIUM 40 MG/0.4ML ~~LOC~~ SOLN: 40.0000 mg | SUBCUTANEOUS | Status: DC

## 2020-03-29 MED ORDER — ONDANSETRON 4 MG PO TBDP: 4.0000 mg | ORAL_TABLET | Freq: Three times a day (TID) | ORAL | Status: DC | PRN

## 2020-03-29 MED ORDER — INSULIN GLARGINE 100 UNIT/ML ~~LOC~~ SOLN
62.0000 [IU] | Freq: Every day | SUBCUTANEOUS | Status: DC
  Administered 2020-03-29: 62 [IU] via SUBCUTANEOUS
  Filled 2020-03-29 (×2): qty 0.62

## 2020-03-29 MED ORDER — DOCUSATE SODIUM 100 MG PO CAPS: 100.0000 mg | ORAL_CAPSULE | Freq: Every day | ORAL | Status: DC | PRN

## 2020-03-29 MED ORDER — FERROUS SULFATE 300 (60 FE) MG/5ML PO SYRP
300.0000 mg | ORAL_SOLUTION | Freq: Every day | ORAL | Status: DC
  Administered 2020-03-29 – 2020-03-31 (×3): 300 mg via ORAL
  Filled 2020-03-29 (×3): qty 5

## 2020-03-29 MED ORDER — INSULIN ASPART 100 UNIT/ML ~~LOC~~ SOLN
0.0000 [IU] | Freq: Three times a day (TID) | SUBCUTANEOUS | Status: DC
  Administered 2020-03-29: 15 [IU] via SUBCUTANEOUS
  Administered 2020-03-30: 8 [IU] via SUBCUTANEOUS
  Administered 2020-03-30: 5 [IU] via SUBCUTANEOUS
  Administered 2020-03-30: 8 [IU] via SUBCUTANEOUS
  Administered 2020-03-31: 5 [IU] via SUBCUTANEOUS

## 2020-03-29 MED ORDER — INSULIN ASPART 100 UNIT/ML ~~LOC~~ SOLN: 0.0000 [IU] | Freq: Every day | SUBCUTANEOUS | Status: DC

## 2020-03-29 MED ORDER — LORATADINE 10 MG PO TABS
10.0000 mg | ORAL_TABLET | Freq: Every day | ORAL | Status: DC | PRN
  Administered 2020-03-29: 10 mg via ORAL
  Filled 2020-03-29: qty 1

## 2020-03-29 MED ORDER — SODIUM CHLORIDE 0.9% FLUSH
3.0000 mL | Freq: Two times a day (BID) | INTRAVENOUS | Status: DC
  Administered 2020-03-29 – 2020-03-31 (×4): 3 mL via INTRAVENOUS

## 2020-03-29 MED ORDER — HEPARIN BOLUS VIA INFUSION
4000.0000 [IU] | Freq: Once | INTRAVENOUS | Status: AC
  Administered 2020-03-29: 4000 [IU] via INTRAVENOUS
  Filled 2020-03-29: qty 4000

## 2020-03-29 MED ORDER — ADULT MULTIVITAMIN W/MINERALS CH
1.0000 | ORAL_TABLET | Freq: Every day | ORAL | Status: DC
  Administered 2020-03-30 – 2020-03-31 (×2): 1 via ORAL
  Filled 2020-03-29 (×2): qty 1

## 2020-03-29 MED ORDER — ACETAMINOPHEN 325 MG PO TABS
650.0000 mg | ORAL_TABLET | Freq: Once | ORAL | Status: AC
  Administered 2020-03-29: 650 mg via ORAL
  Filled 2020-03-29: qty 2

## 2020-03-29 MED ORDER — EVENING PRIMROSE OIL 1000 MG PO CAPS: 1.0000 | ORAL_CAPSULE | Freq: Every day | ORAL | Status: DC

## 2020-03-29 MED ORDER — CALCIUM CARBONATE-VITAMIN D 500-200 MG-UNIT PO TABS
2.0000 | ORAL_TABLET | Freq: Every day | ORAL | Status: DC
  Administered 2020-03-29 – 2020-03-31 (×3): 2 via ORAL
  Filled 2020-03-29 (×3): qty 2

## 2020-03-29 MED ORDER — GLUCOSAMINE CHONDROITIN JOINT PO TABS: ORAL_TABLET | Freq: Every day | ORAL | Status: DC

## 2020-03-29 MED ORDER — HEPARIN (PORCINE) 25000 UT/250ML-% IV SOLN
1200.0000 [IU]/h | INTRAVENOUS | Status: DC
  Administered 2020-03-29: 1050 [IU]/h via INTRAVENOUS
  Filled 2020-03-29: qty 250

## 2020-03-29 NOTE — H&P (Signed)
History and Physical    Tracy Duke:[id number] DOB: 05-Feb-1956 DOA: 03/29/2020  PCP: Patient, No Pcp Per   Patient coming from: Home  I have personally briefly reviewed patient's old medical records in [place]  Chief Complaint: Syncope  HPI: Tracy Duke is a 64 y.o. female with medical history significant of IDDM, hypertension, cirrhosis (question of NASH), remote history of seizure, morbid obesity presented with syncope.  This happened 4 days ago patient was sitting on the couch, and suddenly lost consciousness and slide on the couch unwitnessed, denied any prodrome, no lightheadedness, no blurry vision, no nauseous or vomiting no palpitations.  Patient could not tell how much time later she regained her consciousness, she estimated must be after about 1 to 2 hours later, and she did admitted she was confused, which has been also confirmed.  Last 2 days, she started to have episodes of feeling tiredness, need frequent nap.  Also reported that her stool color has turned darker, she has a remote history of cirrhosis, diagnosed about 10 years ago, but never followed up with any GI in last 10 years.  She used to be on iron pills but stopped a couple years ago. ED Course: Elevated 1st set of trop 0.77, BNP, and D-Dimer, CTA negative for PE, but small B/L pleural effusion.  Review of Systems: As per HPI otherwise 10 point review of systems negative.    Past Medical History:  Diagnosis Date  . Anemia   . Diabetes mellitus without complication ([place])   . GERD (gastroesophageal reflux disease)   . Hypertension   . PONV (postoperative nausea and vomiting)   . Seizures ([place])    History of seizures twenty years ago    Past Surgical History:  Procedure Laterality Date  . BILATERAL CARPAL TUNNEL RELEASE    . CHOLECYSTECTOMY    . HYSTEROSCOPY WITH D & C N/A 10/15/2016   Procedure: DILATATION AND CURETTAGE /HYSTEROSCOPY/POLYPECTOMY;  Surgeon: Benjaman Kindler, MD;  Location:  [facility] ORS;  Service: Gynecology;  Laterality: N/A;  . TUBAL LIGATION       reports that she has never smoked. She has never used smokeless tobacco. She reports that she does not drink alcohol or use drugs.  Allergies  Allergen Reactions  . Penicillins Shortness Of Breath and Swelling  . Codeine Other (See Comments)    Cause seizures    Family History  Problem Relation Age of Onset  . Breast cancer Mother      Prior to Admission medications   Medication Sig Start Date End Date Taking? Authorizing Provider  Calcium Carb-Cholecalciferol (CALCIUM PLUS VITAMIN D3) 600-800 MG-UNIT TABS Take 1 tablet by mouth daily.    [provider]  docusate sodium (COLACE) 100 MG capsule Take 1 capsule (100 mg total) by mouth daily as needed for mild constipation. 10/15/16   Benjaman Kindler, MD  Evening Primrose Oil 1000 MG CAPS Take 1 capsule by mouth at bedtime.    [provider]  Ferrous Sulfate (FEROSUL PO) Take 325 mg by mouth daily.    [provider]  glipiZIDE (GLUCOTROL XL) 10 MG 24 hr tablet Take 10 mg by mouth daily with breakfast.    [provider]  Glucos-Chondroit-Hyaluron-MSM (GLUCOSAMINE CHONDROITIN JOINT PO) Take 1 tablet by mouth at bedtime.    [provider]  Insulin Glargine (LANTUS SOLOSTAR) 100 UNIT/ML Solostar Pen Inject 62 Units into the skin daily at 10 pm.    [provider]  lisinopril (PRINIVIL,ZESTRIL) 20  MG tablet Take 20 mg by mouth daily.    [provider]  loratadine (CLARITIN) 10 MG tablet Take 10 mg by mouth daily.    [provider]  Multiple Vitamins-Minerals (ALIVE WOMENS 50+) TABS Take 1 tablet by mouth daily.    [provider]  ondansetron (ZOFRAN ODT) 4 MG disintegrating tablet Take 1 tablet (4 mg total) by mouth every 8 (eight) hours as needed for nausea or vomiting. 10/15/16   Christeen Douglas, MD  sitaGLIPtin (JANUVIA) 100 MG tablet Take 100 mg by mouth daily.    [provider]    Physical Exam: Vitals:   03/29/20 1627  BP: (!) 116/44  Pulse: (!) 107  Resp: 16  Temp: 98.9 F (37.2 C)  TempSrc: Oral  SpO2: 98%    Constitutional: NAD, calm, comfortable Vitals:   03/29/20 1627  BP: (!) 116/44  Pulse: (!) 107  Resp: 16  Temp: 98.9 F (37.2 C)  TempSrc: Oral  SpO2: 98%   Eyes: PERRL, lids and conjunctivae normal ENMT: Mucous membranes are moist. Posterior pharynx clear of any exudate or lesions.Normal dentition.  Neck: normal, supple, no masses, no thyromegaly Respiratory: clear to auscultation bilaterally, no wheezing, no crackles. Normal respiratory effort. No accessory muscle use.  Cardiovascular: Regular rate and rhythm, soft diastolic murmur on her base.  Trace extremity edema. 2+ pedal pulses. No carotid bruits.  Abdomen: no tenderness, no masses palpated. No hepatosplenomegaly. Bowel sounds positive.  Musculoskeletal: no clubbing / cyanosis. No joint deformity upper and lower extremities. Good ROM, no contractures. Normal muscle tone.  Skin: no rashes, lesions, ulcers. No induration Neurologic: CN 2-12 grossly intact. Sensation intact, DTR normal. Strength 5/5 in all 4.  Psychiatric: Normal judgment and insight. Alert and oriented x 3. Normal mood.     Labs on Admission: I have personally reviewed following labs and imaging studies  CBC: Recent Labs  Lab 03/29/20 1758  WBC 8.7  HGB 11.1*  HCT 33.2*  MCV 90.7  PLT 117*   Basic Metabolic Panel: No results for input(s): NA, K, CL, CO2, GLUCOSE, BUN, CREATININE, CALCIUM, MG, PHOS in the last 168 hours. GFR: CrCl cannot be calculated (Patient's most recent lab result is older than the maximum 21 days allowed.). Liver Function Tests: No results for input(s): AST, ALT, ALKPHOS, BILITOT, PROT, ALBUMIN in the last 168 hours. No results for input(s): LIPASE, AMYLASE in the last 168 hours. No results for input(s): AMMONIA in the last 168 hours. Coagulation Profile: No  results for input(s): INR, PROTIME in the last 168 hours. Cardiac Enzymes: No results for input(s): CKTOTAL, CKMB, CKMBINDEX, TROPONINI in the last 168 hours. BNP (last 3 results) No results for input(s): PROBNP in the last 8760 hours. HbA1C: No results for input(s): HGBA1C in the last 72 hours. CBG: Recent Labs  Lab 03/29/20 1632  GLUCAP 385*   Lipid Profile: No results for input(s): CHOL, HDL, LDLCALC, TRIG, CHOLHDL, LDLDIRECT in the last 72 hours. Thyroid Function Tests: No results for input(s): TSH, T4TOTAL, FREET4, T3FREE, THYROIDAB in the last 72 hours. Anemia Panel: No results for input(s): VITAMINB12, FOLATE, FERRITIN, TIBC, IRON, RETICCTPCT in the last 72 hours. Urine analysis: No results found for: COLORURINE, APPEARANCEUR, LABSPEC, PHURINE, GLUCOSEU, HGBUR, BILIRUBINUR, KETONESUR, PROTEINUR, UROBILINOGEN, NITRITE, LEUKOCYTESUR  Radiological Exams on Admission: No results found.  EKG: Independently reviewed. 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  And this time given the findings of elevated troponins, and elevated BNP, suspect cardiac syncope/arrhythmia.  Consult cardiology who will see the patient tonight Also she had remote history of seizure, will check EEG, probably more for baseline  Melena No significant drop of hemoglobin, will repeat CBC, check FOBT CT abd in 2019 showing: Hepatic cirrhosis and stigmata of portal hypertension including multiple large caliber varices and splenomegaly. If her blood pressure allows, may start a beta-blocker To send liver function test, RUQ ultrasound and INR  Cirrhosis Suspect NASH, as above  Question of CHF And denied any short of breath, no chest pain, CT showing bilateral pleural effusion and lung congestion as well as cardiomegaly suspect  new onset CHF. She is euvolemic, will hold aggressive diuresis for now Check echo  Elevated troponins No significant chest pain, will trend Repeat EKG   DVT prophylaxis: SCD for now until we prove there is no active GI bleed. Code Status: Full code Family Communication: Husband and daughter at bedside  Disposition Plan: Depends on syncope and CHF work-up Consults called: Cardiology Admission status: Telemetry observation   Emeline General MD [facility] Pager [phone]   03/29/2020, 6:41 PM

## 2020-03-29 NOTE — Plan of Care (Signed)

## 2020-03-29 NOTE — Progress Notes (Signed)
CRITICAL VALUE ALERT  Critical Value:  Troponin 261  Date & Time Notied: 03/29/20@ 1859  Provider Notified: Dr.Zhang  Orders Received/Actions taken: to review for further orders.

## 2020-03-29 NOTE — Consult Note (Signed)
Cardiology Consult Note  Reason for consult: + troponin, possible syncope, possible HF  HPI 64 y.o. female with history of insulin dependent DM, cirrhosis, obesity, remote history of seizures.  No cardiac history.  Tuesday was in a chair and husband found her on the floor.  Not sure what happened, thinks she passed out.  No injury.  Unclear how long she was out, thinks it may have been an hour.  Felt very weak after and couldn't stand on her own.  went to ED and sent home.  Had intermittent fever the next few days.  Then in the middle of the night felt "bad" with dizziness (room spinning).  BG was 490s. At OSH ED had + troponin and elev BNP. CTA negative for PE, small b/l pleural effusion.  Transferred.  Denies CP, dyspnea, or edema.  Overall less energy in the past 1-2 months.  No tobacco use.    ECG NSR, RBBB, no ischemia.  Troponin 261.  Glucose 417.  CXR small b/l effusions  ASSESSMENT/PLAN 1. Possible syncope Unclear if this was true syncope vs seizure vs something else.  While some features are concerning for arrhythmic syncope (sudden onset from seated position without prodrome), it is also a little unusual in the duration (possibly an hour), lack of injury, and the persistent weakness after regaining consciousness.  ECG only remarkable for RBBB.  For now would pursue workup of NSTEMI/HF as below, and monitor on telemetry.  2. NSTEMI  No CP but elevated troponin.  ECG without ischemia.  Would treat with IV heparin for ACS despite lack of chest pain, as we do not have another clear explanation for the troponin, and she has risk factors for CAD.  Will get echo tomorrow and then probably [facility] Monday depending on echo results and her clinical course.    3. Possible HF Her CXR and BNP are suggestive of CHF, although she denies dyspnea or edema.  Will f/u echo.  No indication for diuresis given lack of symptoms.      Cardiology Consultation:   Patient ID: Tracy Duke; [id number];  09/11/1956   Admit date: 03/29/2020 Date of Consult: 03/29/2020  Primary Care Provider: Patient, No Pcp Per Primary Cardiologist: No primary care provider on file. Primary Electrophysiologist:  None  Past Medical History:  Diagnosis Date  . Anemia   . Diabetes mellitus without complication ([place])   . GERD (gastroesophageal reflux disease)   . Hypertension   . PONV (postoperative nausea and vomiting)   . Seizures ([place])    History of seizures twenty years ago    Past Surgical History:  Procedure Laterality Date  . BILATERAL CARPAL TUNNEL RELEASE    . CHOLECYSTECTOMY    . HYSTEROSCOPY WITH D & C N/A 10/15/2016   Procedure: DILATATION AND CURETTAGE /HYSTEROSCOPY/POLYPECTOMY;  Surgeon: Benjaman Kindler, MD;  Location: [facility] ORS;  Service: Gynecology;  Laterality: N/A;  . TUBAL LIGATION       Inpatient Medications: Scheduled Meds: . acetaminophen  650 mg Oral Once  . Alive Womens 50+  1 tablet Oral Daily  . Calcium Carb-Cholecalciferol  1 tablet Oral Daily  . Evening Primrose Oil  1 capsule Oral QHS  . ferrous sulfate  325 mg Oral Daily  . Glucosamine Chondroitin Joint   Oral QHS  . insulin aspart  0-15 Units Subcutaneous TID WC  . insulin glargine  62 Units Subcutaneous Q2200  . loratadine  10 mg Oral Daily  . sodium chloride flush  3 mL Intravenous Q12H  Continuous Infusions:  PRN Meds: docusate sodium, ondansetron  Home Meds: Prior to Admission medications   Medication Sig Start Date End Date Taking? Authorizing Provider  Calcium Carb-Cholecalciferol (CALCIUM PLUS VITAMIN D3) 600-800 MG-UNIT TABS Take 1 tablet by mouth daily.   Yes [provider]  glipiZIDE (GLUCOTROL XL) 10 MG 24 hr tablet Take 10 mg by mouth daily with breakfast.   Yes [provider]  Glucos-Chondroit-Hyaluron-MSM (GLUCOSAMINE CHONDROITIN JOINT PO) Take 1 tablet by mouth at bedtime.   Yes [provider]  Insulin Glargine (LANTUS SOLOSTAR) 100 UNIT/ML Solostar Pen Inject 62  Units into the skin daily at 10 pm.   Yes [provider]  lisinopril (PRINIVIL,ZESTRIL) 20 MG tablet Take 20 mg by mouth daily.   Yes [provider]  Multiple Vitamins-Minerals (ALIVE WOMENS 50+) TABS Take 1 tablet by mouth daily.   Yes [provider]  docusate sodium (COLACE) 100 MG capsule Take 1 capsule (100 mg total) by mouth daily as needed for mild constipation. Patient not taking: Reported on 03/29/2020 10/15/16   Christeen Douglas, MD  ondansetron (ZOFRAN ODT) 4 MG disintegrating tablet Take 1 tablet (4 mg total) by mouth every 8 (eight) hours as needed for nausea or vomiting. Patient not taking: Reported on 03/29/2020 10/15/16   Christeen Douglas, MD  sitaGLIPtin (JANUVIA) 100 MG tablet Take 100 mg by mouth daily.    [provider]    Allergies:    Allergies  Allergen Reactions  . Penicillins Shortness Of Breath and Swelling  . Codeine Other (See Comments)    Cause seizures    Social History:   Social History   Socioeconomic History  . Marital status: Married    Spouse name: Not on file  . Number of children: Not on file  . Years of education: Not on file  . Highest education level: Not on file  Occupational History  . Not on file  Tobacco Use  . Smoking status: Never Smoker  . Smokeless tobacco: Never Used  Substance and Sexual Activity  . Alcohol use: No  . Drug use: No  . Sexual activity: Not on file  Other Topics Concern  . Not on file  Social History Narrative  . Not on file   Social Determinants of Health   Financial Resource Strain:   . Difficulty of Paying Living Expenses:   Food Insecurity:   . Worried About Programme researcher, broadcasting/film/video in the Last Year:   . Barista in the Last Year:   Transportation Needs:   . Freight forwarder (Medical):   Marland Kitchen Lack of Transportation (Non-Medical):   Physical Activity:   . Days of Exercise per Week:   . Minutes of Exercise per Session:   Stress:   . Feeling of Stress :    Social Connections:   . Frequency of Communication with Friends and Family:   . Frequency of Social Gatherings with Friends and Family:   . Attends Religious Services:   . Active Member of Clubs or Organizations:   . Attends Banker Meetings:   Marland Kitchen Marital Status:   Intimate Partner Violence:   . Fear of Current or Ex-Partner:   . Emotionally Abused:   Marland Kitchen Physically Abused:   . Sexually Abused:      Family History:    Family History  Problem Relation Age of Onset  . Breast cancer Mother       ROS:  Please see the history of present illness.  All other ROS reviewed and negative.     Physical Exam/Data:   Vitals:   03/29/20 1627 03/29/20 2003  BP: (!) 116/44 125/65  Pulse: (!) 107 (!) 104  Resp: 16 18  Temp: 98.9 F (37.2 C) 99.3 F (37.4 C)  TempSrc: Oral Oral  SpO2: 98% 95%    Intake/Output Summary (Last 24 hours) at 03/29/2020 2018 Last data filed at 03/29/2020 1803 Gross per 24 hour  Intake 220 ml  Output 301 ml  Net -81 ml   Last 3 Weights 10/15/2016 10/07/2016  Weight (lbs) 217 lb 217 lb  Weight (kg) 98.431 kg 98.431 kg     There is no height or weight on file to calculate BMI.  General: Well developed, well nourished, in no acute distress. Head: Normocephalic, atraumatic, sclera non-icteric, no xanthomas, nares are without discharge.  Neck: Negative for carotid bruits. JVD not elevated. Lungs: Clear bilaterally to auscultation without wheezes, rales, or rhonchi. Breathing is unlabored. Heart: RRR with S1 S2. No murmurs, rubs, or gallops appreciated. Abdomen: Soft, non-tender, non-distended with normoactive bowel sounds. No hepatomegaly. No rebound/guarding. No obvious abdominal masses. Msk:  Strength and tone appear normal for age. Extremities: No clubbing or cyanosis. No edema.  Distal pedal pulses are 2+ and equal bilaterally. Neuro: Alert and oriented X 3. No facial asymmetry. No focal deficit. Moves all extremities spontaneously. Psych:   Responds to questions appropriately with a normal affect.    Laboratory Data:  High Sensitivity Troponin:   Recent Labs  Lab 03/29/20 1758  TROPONINIHS 261*     Cardiac EnzymesNo results for input(s): TROPONINI in the last 168 hours. No results for input(s): TROPIPOC in the last 168 hours.  Chemistry Recent Labs  Lab 03/29/20 1758  NA 129*  K 4.8  CL 97*  CO2 22  GLUCOSE 417*  BUN 22  CREATININE 1.01*  CALCIUM 8.6*  GFRNONAA 58*  GFRAA >60  ANIONGAP 10    No results for input(s): PROT, ALBUMIN, AST, ALT, ALKPHOS, BILITOT in the last 168 hours. Hematology Recent Labs  Lab 03/29/20 1758  WBC 8.7  RBC 3.66*  HGB 11.1*  HCT 33.2*  MCV 90.7  MCH 30.3  MCHC 33.4  RDW 13.8  PLT 117*   BNPNo results for input(s): BNP, PROBNP in the last 168 hours.  DDimer No results for input(s): DDIMER in the last 168 hours.   Radiology/Studies:  X-ray chest PA and lateral  Result Date: 03/29/2020 CLINICAL DATA:  64 year old female with syncope. EXAM: CHEST - 2 VIEW COMPARISON:  None. FINDINGS: Small bilateral pleural effusions, left greater right with associated bibasilar atelectasis. Pneumonia is not excluded. Clinical correlation is recommended. There is diffuse interstitial and interlobular septal prominence consistent with edema. There is no pneumothorax. Borderline cardiomegaly. Atherosclerotic calcification of the aorta. No acute osseous pathology. IMPRESSION: Findings of CHF and small bilateral pleural effusions. Pneumonia is not excluded. Clinical correlation is recommended. Electronically Signed   By: Elgie Collard M.D.   On: 03/29/2020 18:51    Signed, Allison Quarry, MD  03/29/2020 8:18 PM

## 2020-03-29 NOTE — Progress Notes (Signed)
Oncoming shift alert of need to do stat EKG as ordered via MD.

## 2020-03-29 NOTE — Progress Notes (Signed)
ANTICOAGULATION CONSULT NOTE - Initial Consult  Pharmacy Consult for heparin Indication: chest pain/ACS  Allergies  Allergen Reactions  . Penicillins Shortness Of Breath and Swelling  . Codeine Other (See Comments)    Cause seizures    Patient Measurements: Height: 5\' 7"  (170.2 cm) Weight: 106.6 kg (235 lb) IBW/kg (Calculated) : 61.6 Heparin Dosing Weight: 85.9 kg  Vital Signs: Temp: 99.3 F (37.4 C) (04/10 2003) Temp Source: Oral (04/10 2003) BP: 125/65 (04/10 2003) Pulse Rate: 104 (04/10 2003)  Labs: Recent Labs    03/29/20 1758 03/29/20 1931  HGB 11.1*  --   HCT 33.2*  --   PLT 117*  --   LABPROT  --  17.9*  INR  --  1.5*  CREATININE 1.01*  --   TROPONINIHS 261* 249*    Estimated Creatinine Clearance: 68.9 mL/min (A) (by C-G formula based on SCr of 1.01 mg/dL (H)).   Medical History: Past Medical History:  Diagnosis Date  . Anemia   . Diabetes mellitus without complication ([facility])   . GERD (gastroesophageal reflux disease)   . Hypertension   . PONV (postoperative nausea and vomiting)   . Seizures ([facility])    History of seizures twenty years ago    Medications:  Scheduled:  . acetaminophen  650 mg Oral Once  . calcium-vitamin D  2 tablet Oral Daily  . ferrous sulfate  300 mg Oral Daily  . insulin aspart  0-15 Units Subcutaneous TID WC  . insulin glargine  62 Units Subcutaneous Q2200  . multivitamin with minerals  1 tablet Oral Daily  . sodium chloride flush  3 mL Intravenous Q12H    Assessment: 64 yof found to have + troponin with possible syncope on past Tuesday. No AC PTA.   INR 1.5. Hgb 11.1, plt 117, trop 249. No s/sx of bleeding.   Goal of Therapy:  Heparin level 0.3-0.7 units/ml Monitor platelets by anticoagulation protocol: Yes   Plan:  Give 4000 units bolus x 1 Start heparin infusion at 1050 units/hr Check anti-Xa level in 6 hours and daily while on heparin Continue to monitor H&H and platelets  Sunday, PharmD,  BCCCP Clinical Pharmacist  Phone: [phone] 03/29/2020 8:51 PM  Please check AMION for all [facility] Pharmacy phone numbers After 10:00 PM, call Main Pharmacy [phone]

## 2020-03-30 ENCOUNTER — Encounter (HOSPITAL_COMMUNITY): Payer: Self-pay | Admitting: Internal Medicine

## 2020-03-30 ENCOUNTER — Observation Stay (HOSPITAL_COMMUNITY): Payer: Medicare PPO

## 2020-03-30 DIAGNOSIS — K219 Gastro-esophageal reflux disease without esophagitis: Secondary | ICD-10-CM | POA: Diagnosis present

## 2020-03-30 DIAGNOSIS — E119 Type 2 diabetes mellitus without complications: Secondary | ICD-10-CM | POA: Diagnosis present

## 2020-03-30 DIAGNOSIS — Z91010 Allergy to peanuts: Secondary | ICD-10-CM | POA: Diagnosis not present

## 2020-03-30 DIAGNOSIS — I110 Hypertensive heart disease with heart failure: Secondary | ICD-10-CM | POA: Diagnosis present

## 2020-03-30 DIAGNOSIS — Z79899 Other long term (current) drug therapy: Secondary | ICD-10-CM | POA: Diagnosis not present

## 2020-03-30 DIAGNOSIS — I5032 Chronic diastolic (congestive) heart failure: Secondary | ICD-10-CM | POA: Diagnosis present

## 2020-03-30 DIAGNOSIS — I4510 Unspecified right bundle-branch block: Secondary | ICD-10-CM | POA: Diagnosis present

## 2020-03-30 DIAGNOSIS — R079 Chest pain, unspecified: Secondary | ICD-10-CM | POA: Diagnosis not present

## 2020-03-30 DIAGNOSIS — Z5329 Procedure and treatment not carried out because of patient's decision for other reasons: Secondary | ICD-10-CM | POA: Diagnosis present

## 2020-03-30 DIAGNOSIS — R55 Syncope and collapse: Principal | ICD-10-CM

## 2020-03-30 DIAGNOSIS — Z885 Allergy status to narcotic agent status: Secondary | ICD-10-CM | POA: Diagnosis not present

## 2020-03-30 DIAGNOSIS — R778 Other specified abnormalities of plasma proteins: Secondary | ICD-10-CM

## 2020-03-30 DIAGNOSIS — I350 Nonrheumatic aortic (valve) stenosis: Secondary | ICD-10-CM

## 2020-03-30 DIAGNOSIS — Z803 Family history of malignant neoplasm of breast: Secondary | ICD-10-CM | POA: Diagnosis not present

## 2020-03-30 DIAGNOSIS — K7581 Nonalcoholic steatohepatitis (NASH): Secondary | ICD-10-CM | POA: Diagnosis present

## 2020-03-30 DIAGNOSIS — Z794 Long term (current) use of insulin: Secondary | ICD-10-CM | POA: Diagnosis not present

## 2020-03-30 DIAGNOSIS — Z20822 Contact with and (suspected) exposure to covid-19: Secondary | ICD-10-CM | POA: Diagnosis present

## 2020-03-30 LAB — HEPARIN LEVEL (UNFRACTIONATED)
Heparin Unfractionated: 0.26 IU/mL — ABNORMAL LOW (ref 0.30–0.70)
Heparin Unfractionated: 0.1 IU/mL — ABNORMAL LOW (ref 0.30–0.70)

## 2020-03-30 LAB — GLUCOSE, CAPILLARY
Glucose-Capillary: 283 mg/dL — ABNORMAL HIGH (ref 70–99)
Glucose-Capillary: 278 mg/dL — ABNORMAL HIGH (ref 70–99)
Glucose-Capillary: 207 mg/dL — ABNORMAL HIGH (ref 70–99)
Glucose-Capillary: 256 mg/dL — ABNORMAL HIGH (ref 70–99)

## 2020-03-30 LAB — BASIC METABOLIC PANEL
Anion gap: 12 (ref 5–15)
BUN: 23 mg/dL (ref 8–23)
CO2: 22 mmol/L (ref 22–32)
Calcium: 8.7 mg/dL — ABNORMAL LOW (ref 8.9–10.3)
Chloride: 97 mmol/L — ABNORMAL LOW (ref 98–111)
Creatinine, Ser: 1.12 mg/dL — ABNORMAL HIGH (ref 0.44–1.00)
GFR calc Af Amer: 59 mL/min — ABNORMAL LOW (ref >60)
GFR calc non Af Amer: 51 mL/min — ABNORMAL LOW (ref >60)
Glucose, Bld: 312 mg/dL — ABNORMAL HIGH (ref 70–99)
Potassium: 3.8 mmol/L (ref 3.5–5.1)
Sodium: 131 mmol/L — ABNORMAL LOW (ref 135–145)

## 2020-03-30 LAB — SARS CORONAVIRUS 2 (TAT 6-24 HRS): SARS Coronavirus 2: NEGATIVE

## 2020-03-30 LAB — ECHOCARDIOGRAM COMPLETE
Height: 67 in
Weight: 3760.17 oz

## 2020-03-30 LAB — MRSA PCR SCREENING: MRSA by PCR: NEGATIVE

## 2020-03-30 MED ORDER — ENOXAPARIN SODIUM 60 MG/0.6ML ~~LOC~~ SOLN
0.5000 mg/kg | SUBCUTANEOUS | Status: DC
  Administered 2020-03-30 – 2020-03-31 (×2): 55 mg via SUBCUTANEOUS
  Filled 2020-03-30 (×2): qty 0.6

## 2020-03-30 MED ORDER — PERFLUTREN LIPID MICROSPHERE
1.0000 mL | INTRAVENOUS | Status: AC | PRN
  Administered 2020-03-30: 2 mL via INTRAVENOUS
  Filled 2020-03-30: qty 10

## 2020-03-30 MED ORDER — ACETAMINOPHEN 325 MG PO TABS
650.0000 mg | ORAL_TABLET | Freq: Four times a day (QID) | ORAL | Status: DC | PRN
  Administered 2020-03-30 (×2): 650 mg via ORAL
  Filled 2020-03-30 (×2): qty 2

## 2020-03-30 MED ORDER — INSULIN GLARGINE 100 UNIT/ML ~~LOC~~ SOLN
70.0000 [IU] | Freq: Every day | SUBCUTANEOUS | Status: DC
  Administered 2020-03-30: 70 [IU] via SUBCUTANEOUS
  Filled 2020-03-30 (×2): qty 0.7

## 2020-03-30 NOTE — Progress Notes (Signed)
ANTICOAGULATION CONSULT NOTE   Pharmacy Consult for Heparin Indication: chest pain/ACS  Allergies  Allergen Reactions  . Penicillins Shortness Of Breath and Swelling  . Codeine Other (See Comments)    Cause seizures    Patient Measurements: Height: 5\' 7"  (170.2 cm) Weight: 106.6 kg (235 lb) IBW/kg (Calculated) : 61.6 Heparin Dosing Weight: 85.9 kg  Vital Signs: Temp: 98.7 F (37.1 C) (04/10 2356) Temp Source: Oral (04/10 2356) BP: 106/37 (04/10 2356) Pulse Rate: 95 (04/10 2356)  Labs: Recent Labs    03/29/20 1758 03/29/20 1931 03/30/20 0334  HGB 11.1*  --   --   HCT 33.2*  --   --   PLT 117*  --   --   LABPROT  --  17.9*  --   INR  --  1.5*  --   HEPARINUNFRC  --   --  0.26*  CREATININE 1.01*  --   --   TROPONINIHS 261* 249*  --     Estimated Creatinine Clearance: 68.9 mL/min (A) (by C-G formula based on SCr of 1.01 mg/dL (H)).   Medical History: Past Medical History:  Diagnosis Date  . Anemia   . Diabetes mellitus without complication ([facility])   . GERD (gastroesophageal reflux disease)   . Hypertension   . PONV (postoperative nausea and vomiting)   . Seizures ([facility])    History of seizures twenty years ago    Medications:  Scheduled:  . calcium-vitamin D  2 tablet Oral Daily  . ferrous sulfate  300 mg Oral Daily  . insulin aspart  0-15 Units Subcutaneous TID WC  . insulin glargine  62 Units Subcutaneous Q2200  . multivitamin with minerals  1 tablet Oral Daily  . sodium chloride flush  3 mL Intravenous Q12H    Assessment: 64 yof found to have + troponin with possible syncope on past Tuesday. No AC PTA.   INR 1.5. Hgb 11.1, plt 117, trop 249. No s/sx of bleeding.   4/11 AM update:  Initial heparin level just below goal  Goal of Therapy:  Heparin level 0.3-0.7 units/ml Monitor platelets by anticoagulation protocol: Yes   Plan:  Inc heparin to 1200 units/hr Re-check heparin level at 1300  6/11, PharmD, BCPS Clinical  Pharmacist Phone: [phone]

## 2020-03-30 NOTE — Progress Notes (Signed)
Patient swabbed for covid-19 again

## 2020-03-30 NOTE — Progress Notes (Signed)
  Echocardiogram 2D Echocardiogram has been performed.  Tracy Duke A Ford Peddie 03/30/2020, 9:43 AM

## 2020-03-30 NOTE — Progress Notes (Signed)
Pts came from [place] per report and she had covid negative test over there. Confirmed by paperwork placed in the patients chart.When we wanted to do it again here this morning she refused and stated that if she needs to go trough that again she will leave AMA. Notified charge nurse and MD Osei- Bonsu.

## 2020-03-30 NOTE — Progress Notes (Signed)
MD aware, will follow up.

## 2020-03-30 NOTE — Progress Notes (Signed)
Progress Note  Patient Name: Tracy Duke Date of Encounter: 03/30/2020  Primary Cardiologist:  New ,Sebastian Dzik   Subjective   64 year old female who had an episode of syncope yesterday.  She was out for approximately 1 hour.  She had persistent weakness following the episode.  Glucose levels are markedly elevated.  There were 301 on admission and increased to 417 at 1758 yesterday evening.  Troponin levels are minimally elevated with a flat and ascending trend that is not consistent with acute coronary syndrome ( 261, 249)   Echo has been done.    Prelim views show normal LV function Mild AS, grade 2 diastolic dysfunction.  No cp .   Inpatient Medications    Scheduled Meds: . calcium-vitamin D  2 tablet Oral Daily  . ferrous sulfate  300 mg Oral Daily  . insulin aspart  0-15 Units Subcutaneous TID WC  . insulin glargine  62 Units Subcutaneous Q2200  . multivitamin with minerals  1 tablet Oral Daily  . sodium chloride flush  3 mL Intravenous Q12H   Continuous Infusions: . heparin 1,200 Units/hr (03/30/20 0436)   PRN Meds: docusate sodium, loratadine, ondansetron, perflutren lipid microspheres (DEFINITY) IV suspension   Vital Signs    Vitals:   03/29/20 2356 03/30/20 0005 03/30/20 0451 03/30/20 0834  BP: (!) 106/37  (!) 108/37 (!) 116/44  Pulse: 95  92 92  Resp: 18  18 20   Temp: 98.7 F (37.1 C)  98.8 F (37.1 C) 98.3 F (36.8 C)  TempSrc: Oral  Oral Oral  SpO2: 98%  97% 96%  Weight:  106.6 kg    Height:        Intake/Output Summary (Last 24 hours) at 03/30/2020 1115 Last data filed at 03/30/2020 0300 Gross per 24 hour  Intake 297.63 ml  Output 301 ml  Net -3.37 ml   Last 3 Weights 03/30/2020 03/29/2020 10/15/2016  Weight (lbs) 235 lb 0.2 oz 235 lb 217 lb  Weight (kg) 106.6 kg 106.595 kg 98.431 kg      Telemetry    Sinus tach- Personally Reviewed  ECG     sinus rhythm,  RBBB - Personally Reviewed  Physical Exam   GEN: No acute distress.   Neck:  No JVD Cardiac: RRR, no murmurs, rubs, or gallops.  Respiratory: Clear to auscultation bilaterally. GI: Soft, nontender, non-distended  MS: No edema; No deformity. Neuro:  Nonfocal  Psych: Normal affect   Labs    High Sensitivity Troponin:   Recent Labs  Lab 03/29/20 1758 03/29/20 1931  TROPONINIHS 261* 249*      Chemistry Recent Labs  Lab 03/29/20 1758 03/29/20 1931 03/30/20 0334  NA 129*  --  131*  K 4.8  --  3.8  CL 97*  --  97*  CO2 22  --  22  GLUCOSE 417*  --  312*  BUN 22  --  23  CREATININE 1.01*  --  1.12*  CALCIUM 8.6*  --  8.7*  PROT  --  6.3*  --   ALBUMIN  --  2.6*  --   AST  --  28  --   ALT  --  22  --   ALKPHOS  --  86  --   BILITOT  --  1.2  --   GFRNONAA 58*  --  51*  GFRAA >60  --  59*  ANIONGAP 10  --  12     Hematology Recent Labs  Lab 03/29/20 1758  WBC  8.7  RBC 3.66*  HGB 11.1*  HCT 33.2*  MCV 90.7  MCH 30.3  MCHC 33.4  RDW 13.8  PLT 117*    BNPNo results for input(s): BNP, PROBNP in the last 168 hours.   DDimer No results for input(s): DDIMER in the last 168 hours.   Radiology    X-ray chest PA and lateral  Result Date: 03/29/2020 CLINICAL DATA:  64 year old female with syncope. EXAM: CHEST - 2 VIEW COMPARISON:  None. FINDINGS: Small bilateral pleural effusions, left greater right with associated bibasilar atelectasis. Pneumonia is not excluded. Clinical correlation is recommended. There is diffuse interstitial and interlobular septal prominence consistent with edema. There is no pneumothorax. Borderline cardiomegaly. Atherosclerotic calcification of the aorta. No acute osseous pathology. IMPRESSION: Findings of CHF and small bilateral pleural effusions. Pneumonia is not excluded. Clinical correlation is recommended. Electronically Signed   By: Anner Crete M.D.   On: 03/29/2020 18:51   US Abdomen Limited RUQ  Result Date: 03/29/2020 CLINICAL DATA:  Cirrhosis EXAM: ULTRASOUND ABDOMEN LIMITED RIGHT UPPER QUADRANT  COMPARISON:  CT 03/29/2020 FINDINGS: Gallbladder: Surgically absent Common bile duct: Diameter: Up to 4.7 mm Liver: Coarse nodular echotexture without dominant mass. Portal vein is patent on color Doppler imaging with normal direction of blood flow towards the liver. Other: None. IMPRESSION: 1. Status post cholecystectomy.  Negative for biliary dilatation 2. Liver cirrhosis without focal hepatic abnormality by sonography. Electronically Signed   By: Donavan Foil M.D.   On: 03/29/2020 22:55    Cardiac Studies      Patient Profile     64 y.o. female admitted with prolonged episode of syncope   Assessment & Plan    1.   Syncope:   She had a prolonged episode of syncope lasting between 1 and 3 hours.  Despite this, there is no troponin elevation that is consistent with acute coronary syndrome.  Her troponins are only mildly elevated which is more consistent with CHF. Glucose levels were extremely elevated and I suspect that her episode of syncope may be related to a metabolic issue.  Because the mildly elevated troponin levels and the history of diabetes mellitus, it would be reasonable to do a stress Myoview study.  We will schedule for tomorrow  She lives in [place].   We can see her up in [place] if needed but she may want to follow up in [place] which is closer   2.  DM:  Plans per primary md  3.  Elevated troponin levels: The trend is not consistent with an acute coronary syndrome.  Is more consistent with heart failure.  We will schedule her for a stress Myoview study tomorrow.  For questions or updates, please contact [place] Please consult [email or web address] for contact info under        Signed, Mertie Moores, MD  03/30/2020, 11:15 AM

## 2020-03-30 NOTE — Progress Notes (Signed)
[facility]                                                                              Patient Demographics  Tracy Duke, is a 64 y.o. female, DOB - 03-19-1956, WEX:[id number]  Admit date - 03/29/2020   Admitting Physician Lequita Halt, MD  Outpatient Primary MD for the patient is Patient, No Pcp Per  Outpatient specialists:   LOS - 1  days    No chief complaint on file.      Brief summary  Tracy Duke is a 64 y.o. female with medical history significant of IDDM, hypertension, cirrhosis (question of NASH), remote history of seizure, morbid obesity presenting with 1 episode of syncope.  She was out for nearly an hour and was feeling very weak following the episode.  At the emergency room there was no evidence of acute pulmonary syndrome per EKG but had elevated B-type natriuretic peptide with mild elevated cardiac enzymes with edema on chest x-ray.  Patient admitted to hospital service for syncope.    Assessment & Plan    Active Problems:   Syncope  Syncope Suspect cardiac- elevated troponins, and elevated BNP,  with edema chest x-ray  Remote history of seizure- EEG ordered on admission pending  CHF Elevated B-type natriuretic peptide with edema chest x-ray CT chest- bilateral pleural effusion and lung congestion as well as cardiomegaly  She is euvolemic, no aggressive diuresis admission 2D echo pending Cardiology consult-suspect troponin elevation due to CHF rather than acute coronary syndrome but because associated history of diabetes stress Myoview scheduled for tomorrow 03/31/2020  Elevated troponins No significant chest pain Trended on admission No evidence of acute coronary syndrome   Melena No significant drop of hemoglobin Hemodynamically stable Check FOBT Follow-up H&H   Insulin-dependent diabetes mellitus Uncontrolled A1c pending Optimize glycemic control Consider Metformin if no cardiac cath  Hepatic cirrhosis Suspect  NASH CT abd in 2019 -Hepatic cirrhosis and stigmata of portal hypertension including multiple large caliber varices and splenomegaly. 03/29/2020-pro time 17.9, INR 1.5 Abdominal ultrasound 03/29/2020-cirrhosis of the liver without focal hepatic lesion Consider beta-blocker Outpatient follow-up on discharge         Code Status: Full code DVT Prophylaxis:  SCD Family Communication: Discussed in detail with the patient, all imaging results, lab results explained to the patient    Disposition Plan: Home  Time Spent in minutes   35 minutes  Procedures:  Stress Myoview planned for tomorrow 03/31/2020  Consultants:   Cardiology, Dr. Charissa Bash Antimicrobials:      Medications  Scheduled Meds: . calcium-vitamin D  2 tablet Oral Daily  . ferrous sulfate  300 mg Oral Daily  . insulin aspart  0-15 Units Subcutaneous TID WC  . insulin glargine  62 Units Subcutaneous Q2200  . multivitamin with minerals  1 tablet Oral Daily  . sodium chloride flush  3 mL Intravenous Q12H   Continuous Infusions: PRN Meds:.docusate sodium, loratadine, ondansetron   Antibiotics   Anti-infectives (From admission, onward)   None        Subjective:   Unique Sillas was seen and examined today.  No chest pain, no shortness  of breath  Objective:   Vitals:   03/29/20 2356 03/30/20 0005 03/30/20 0451 03/30/20 0834  BP: (!) 106/37  (!) 108/37 (!) 116/44  Pulse: 95  92 92  Resp: 18  18 20   Temp: 98.7 F (37.1 C)  98.8 F (37.1 C) 98.3 F (36.8 C)  TempSrc: Oral  Oral Oral  SpO2: 98%  97% 96%  Weight:  106.6 kg    Height:        Intake/Output Summary (Last 24 hours) at 03/30/2020 1312 Last data filed at 03/30/2020 1218 Gross per 24 hour  Intake 297.63 ml  Output 601 ml  Net -303.37 ml     Wt Readings from Last 3 Encounters:  03/30/20 106.6 kg  10/15/16 98.4 kg  10/07/16 98.4 kg     Exam  General: NAD  HEENT: NCAT,  PERRL,MMM  Neck: SUPPLE, (-) JVD  Cardiovascular:  RRR, (-) GALLOP, (-) MURMUR  Respiratory: CTA  Gastrointestinal: SOFT, (-) DISTENSION, BS(+), (_) TENDERNESS  Ext: (-) CYANOSIS, (-) EDEMA  Neuro: A, OX 3  Skin:(-) RASH  Psych:NORMAL AFFECT/MOOD   Data Reviewed:  I have personally reviewed following labs and imaging studies  Micro Results No results found for this or any previous visit (from the past 240 hour(s)).  Radiology Reports X-ray chest PA and lateral  Result Date: 03/29/2020 CLINICAL DATA:  64 year old female with syncope. EXAM: CHEST - 2 VIEW COMPARISON:  None. FINDINGS: Small bilateral pleural effusions, left greater right with associated bibasilar atelectasis. Pneumonia is not excluded. Clinical correlation is recommended. There is diffuse interstitial and interlobular septal prominence consistent with edema. There is no pneumothorax. Borderline cardiomegaly. Atherosclerotic calcification of the aorta. No acute osseous pathology. IMPRESSION: Findings of CHF and small bilateral pleural effusions. Pneumonia is not excluded. Clinical correlation is recommended. Electronically Signed   By: 69 M.D.   On: 03/29/2020 18:51   05/29/2020 Abdomen Limited RUQ  Result Date: 03/29/2020 CLINICAL DATA:  Cirrhosis EXAM: ULTRASOUND ABDOMEN LIMITED RIGHT UPPER QUADRANT COMPARISON:  CT 03/29/2020 FINDINGS: Gallbladder: Surgically absent Common bile duct: Diameter: Up to 4.7 mm Liver: Coarse nodular echotexture without dominant mass. Portal vein is patent on color Doppler imaging with normal direction of blood flow towards the liver. Other: None. IMPRESSION: 1. Status post cholecystectomy.  Negative for biliary dilatation 2. Liver cirrhosis without focal hepatic abnormality by sonography. Electronically Signed   By: 05/29/2020 M.D.   On: 03/29/2020 22:55    Lab Data:  CBC: Recent Labs  Lab 03/29/20 1758  WBC 8.7  HGB 11.1*  HCT 33.2*  MCV 90.7  PLT 117*   Basic Metabolic Panel: Recent Labs  Lab 03/29/20 1758  03/30/20 0334  NA 129* 131*  K 4.8 3.8  CL 97* 97*  CO2 22 22  GLUCOSE 417* 312*  BUN 22 23  CREATININE 1.01* 1.12*  CALCIUM 8.6* 8.7*  MG 1.9  --    GFR: Estimated Creatinine Clearance: 62.1 mL/min (A) (by C-G formula based on SCr of 1.12 mg/dL (H)). Liver Function Tests: Recent Labs  Lab 03/29/20 1931  AST 28  ALT 22  ALKPHOS 86  BILITOT 1.2  PROT 6.3*  ALBUMIN 2.6*   No results for input(s): LIPASE, AMYLASE in the last 168 hours. No results for input(s): AMMONIA in the last 168 hours. Coagulation Profile: Recent Labs  Lab 03/29/20 1931  INR 1.5*   Cardiac Enzymes: No results for input(s): CKTOTAL, CKMB, CKMBINDEX, TROPONINI in the last 168 hours. BNP (last 3 results) No  results for input(s): PROBNP in the last 8760 hours. HbA1C: No results for input(s): HGBA1C in the last 72 hours. CBG: Recent Labs  Lab 03/29/20 1632 03/29/20 2106 03/30/20 0553 03/30/20 1143  GLUCAP 385* 353* 283* 278*   Lipid Profile: No results for input(s): CHOL, HDL, LDLCALC, TRIG, CHOLHDL, LDLDIRECT in the last 72 hours. Thyroid Function Tests: No results for input(s): TSH, T4TOTAL, FREET4, T3FREE, THYROIDAB in the last 72 hours. Anemia Panel: Recent Labs    03/29/20 1931  FERRITIN 231  TIBC 269  IRON 29   Urine analysis: No results found for: COLORURINE, APPEARANCEUR, LABSPEC, PHURINE, GLUCOSEU, HGBUR, BILIRUBINUR, KETONESUR, PROTEINUR, UROBILINOGEN, NITRITE, Murrell Converse M.D. [facility] 03/30/2020, 1:12 PM  Pager: [phone] Between 7am to 7pm - call Pager - [phone]  After 7pm go to [email or web address] - password [email or web address]  Call night coverage person covering after 7pm

## 2020-03-30 NOTE — Progress Notes (Signed)
During the shift report it was started that patient haa a fall a few weeks ago. Patient informed about policy about low bed and bed alarm. Also patient educated about Heparin that patient is currently receiving and problems that patient might experience if patient falls while on this meeication. Patient stated that she is ok in this bed and she does not need bed alarm, will call if needed. Will continue to monitor.

## 2020-03-31 ENCOUNTER — Inpatient Hospital Stay (HOSPITAL_COMMUNITY): Payer: Medicare PPO

## 2020-03-31 ENCOUNTER — Telehealth: Payer: Self-pay | Admitting: Physician Assistant

## 2020-03-31 DIAGNOSIS — I5032 Chronic diastolic (congestive) heart failure: Secondary | ICD-10-CM

## 2020-03-31 DIAGNOSIS — R079 Chest pain, unspecified: Secondary | ICD-10-CM

## 2020-03-31 DIAGNOSIS — E119 Type 2 diabetes mellitus without complications: Secondary | ICD-10-CM

## 2020-03-31 DIAGNOSIS — Z794 Long term (current) use of insulin: Secondary | ICD-10-CM

## 2020-03-31 DIAGNOSIS — R55 Syncope and collapse: Secondary | ICD-10-CM

## 2020-03-31 HISTORY — DX: Long term (current) use of insulin: Z79.4

## 2020-03-31 HISTORY — DX: Type 2 diabetes mellitus without complications: E11.9

## 2020-03-31 HISTORY — DX: Chronic diastolic (congestive) heart failure: I50.32

## 2020-03-31 LAB — COMPREHENSIVE METABOLIC PANEL
ALT: 21 U/L (ref 0–44)
AST: 30 U/L (ref 15–41)
Albumin: 2.6 g/dL — ABNORMAL LOW (ref 3.5–5.0)
Alkaline Phosphatase: 84 U/L (ref 38–126)
Anion gap: 10 (ref 5–15)
BUN: 16 mg/dL (ref 8–23)
CO2: 25 mmol/L (ref 22–32)
Calcium: 8.8 mg/dL — ABNORMAL LOW (ref 8.9–10.3)
Chloride: 100 mmol/L (ref 98–111)
Creatinine, Ser: 1.02 mg/dL — ABNORMAL HIGH (ref 0.44–1.00)
GFR calc Af Amer: >60 mL/min (ref >60)
GFR calc non Af Amer: 57 mL/min — ABNORMAL LOW (ref >60)
Glucose, Bld: 251 mg/dL — ABNORMAL HIGH (ref 70–99)
Potassium: 4.6 mmol/L (ref 3.5–5.1)
Sodium: 135 mmol/L (ref 135–145)
Total Bilirubin: 1.3 mg/dL — ABNORMAL HIGH (ref 0.3–1.2)
Total Protein: 6.4 g/dL — ABNORMAL LOW (ref 6.5–8.1)

## 2020-03-31 LAB — GLUCOSE, CAPILLARY
Glucose-Capillary: 233 mg/dL — ABNORMAL HIGH (ref 70–99)
Glucose-Capillary: 287 mg/dL — ABNORMAL HIGH (ref 70–99)
Glucose-Capillary: 206 mg/dL — ABNORMAL HIGH (ref 70–99)

## 2020-03-31 LAB — NM MYOCAR MULTI W/SPECT W/WALL MOTION / EF
Peak HR: 101 {beats}/min
Rest HR: 91 {beats}/min

## 2020-03-31 LAB — HEMOGLOBIN A1C
Hgb A1c MFr Bld: 10.2 % — ABNORMAL HIGH (ref 4.8–5.6)
Mean Plasma Glucose: 246 mg/dL

## 2020-03-31 MED ORDER — INSULIN ASPART 100 UNIT/ML ~~LOC~~ SOLN: 3.0000 [IU] | Freq: Three times a day (TID) | SUBCUTANEOUS | Status: DC

## 2020-03-31 MED ORDER — INSULIN ASPART 100 UNIT/ML ~~LOC~~ SOLN
3.0000 [IU] | Freq: Three times a day (TID) | SUBCUTANEOUS | Status: DC
  Administered 2020-03-31 (×2): 3 [IU] via SUBCUTANEOUS

## 2020-03-31 MED ORDER — INSULIN ASPART 100 UNIT/ML ~~LOC~~ SOLN
0.0000 [IU] | Freq: Three times a day (TID) | SUBCUTANEOUS | Status: DC
  Administered 2020-03-31: 8 [IU] via SUBCUTANEOUS
  Administered 2020-03-31: 5 [IU] via SUBCUTANEOUS

## 2020-03-31 MED ORDER — REGADENOSON 0.4 MG/5ML IV SOLN
0.4000 mg | Freq: Once | INTRAVENOUS | Status: AC
  Administered 2020-03-31: 0.4 mg via INTRAVENOUS
  Filled 2020-03-31: qty 5

## 2020-03-31 MED ORDER — TECHNETIUM TC 99M TETROFOSMIN IV KIT
10.0000 | PACK | Freq: Once | INTRAVENOUS | Status: AC | PRN
  Administered 2020-03-31: 10 via INTRAVENOUS

## 2020-03-31 MED ORDER — REGADENOSON 0.4 MG/5ML IV SOLN
INTRAVENOUS | Status: AC
  Filled 2020-03-31: qty 5

## 2020-03-31 MED ORDER — INSULIN GLARGINE 100 UNIT/ML ~~LOC~~ SOLN
40.0000 [IU] | Freq: Two times a day (BID) | SUBCUTANEOUS | Status: DC
  Administered 2020-03-31: 40 [IU] via SUBCUTANEOUS
  Filled 2020-03-31 (×2): qty 0.4

## 2020-03-31 MED ORDER — INSULIN ASPART 100 UNIT/ML ~~LOC~~ SOLN: 0.0000 [IU] | Freq: Every day | SUBCUTANEOUS | Status: DC

## 2020-03-31 MED ORDER — INSULIN ASPART 100 UNIT/ML ~~LOC~~ SOLN: 0.0000 [IU] | Freq: Three times a day (TID) | SUBCUTANEOUS | Status: DC

## 2020-03-31 NOTE — Progress Notes (Signed)
After discussion with MD and RN patient decided to leave AMA. IV and teli removed. Patient left AMA with her personal belongings, and her husband. AMA form signed in the chart.

## 2020-03-31 NOTE — Telephone Encounter (Signed)
   Hi triage, This patient left AMA this evening from the hospital despite abnormal stress test results. Internal medicine reviewed with her but we had not gotten the chance to speak with her before she left. I tried to call patient but got no answer. Dr. Harrell Gave rounded on her today. (I have not met her before.)  Results: IMPRESSION: 1. Moderate regions of inducible ischemia along the lateral and septal walls at the cardiac base. Matched scarring along the cardiac apex. 2. Mild lateral wall hypokinesis particularly at the base. 3. Left ventricular ejection fraction 54% 4. Non invasive risk stratification*: High (due to having two moderate regions of inducible ischemia along with moderate end-diastolic left ventricular dilatation).  Dr. Harrell Gave had personally reviewed the images herself and did not feel they were high risk findings or that it would have contributed to her syncope. She would recommend close f/u in the next few days to discuss further cardiac testing and possible cath as well as 30 day live event monitor to evaluate or rhythm problems. Please call patient to let her know and arrange if she is agreeable. She is technically a [facility] patient because he saw her first in the hospital. I put in event monitor order. Please tell her she should not drive until further clearance from her medical team.  Melina Copa PA-C

## 2020-03-31 NOTE — Progress Notes (Signed)
Inpatient Diabetes Program Recommendations  AACE/ADA: New Consensus Statement on Inpatient Glycemic Control (2015)  Target Ranges:  Prepandial:   less than 140 mg/dL      Peak postprandial:   less than 180 mg/dL (1-2 hours)      Critically ill patients:  140 - 180 mg/dL   Lab Results  Component Value Date   GLUCAP 287 (H) 03/31/2020   HGBA1C 10.2 (H) 03/30/2020    Review of Glycemic Control  Results for IJEOMA, LOOR (MRN [id number]) as of 03/31/2020 14:28  Ref. Range 03/30/2020 11:43 03/30/2020 16:14 03/30/2020 21:20 03/31/2020 06:52 03/31/2020 12:03  Glucose-Capillary Latest Ref Range: 70 - 99 mg/dL 409 (H) 811 (H) 914 (H) 233 (H) 287 (H)    Diabetes history: DM2  Outpatient Diabetes medications: Lantus 60 units BID, Glipizide 10 mg QD, (does not take Januvia 100mg  QD-Can't afford)  Current orders for Inpatient glycemic control: Lantus 40 units BID, novolog 0-15 units TID, 0-5 QHS, novolog 3 units TID  Inpatient Diabetes Program Recommendations:     Lantus 50 units BID if stays inpatient  Note: Reviewed patient's current A1c of 10.2% (average CBG 246 mg/dl). Explained what a A1c is and what it measures. Also reviewed goal A1c with patient, importance of good glucose control @ home, and blood sugar goals.  Patient states she takes the above medications.  She rotates sites on her abdomen.  Not able to afford Januvia, therefore does not take.  She is current with her PCP but is looking for a new PCP.  Does not drink any sugary drinks and states she tries to watch her CHO intake.  She and her husband attend [employer].  She is aware if she has a low and treats with juice.  Reviewed the Plate Method.  Will continue to follow while inpatient.  Thank you, , RN, BSN Diabetes Coordinator Inpatient Diabetes Program [phone] (team pager from 8a-5p)

## 2020-03-31 NOTE — Progress Notes (Signed)
Portable EEG completed, results pending. 

## 2020-03-31 NOTE — Progress Notes (Signed)
[facility] PROGRESS NOTE    Progress Note  LATROYA NG  JME:[id number] DOB: 07-09-1956 DOA: 03/29/2020 PCP: Patient, No Pcp Per     Brief Narrative:   Tracy Duke is an 64 y.o. female past medical history significant for insulin-dependent diabetes mellitus, essential hypertension cirrhosis remote history of seizures presents with a syncopal episode that happened 4 days prior to admission while she was sitting in the couch she relates loss of consciousness and denies any prodromal symptoms.  Is nearly out for an hour feeling weak.   Assessment/Plan:   Possible syncope elevated troponins: Of unclear etiology as well some features are concerning for arrhythmia it is also unusual for the duration.  Biomarkers were mildly elevated and more flat than anything else more consistent with heart failure.  Repeat blood glucose was extremely elevated. Cardiology was consulted who recommended a stress test Myoview is scheduled for 03/31/2020, which showed wall motion abnormality. Awaiting cards recommendations. Per records FOBT positive at [facility] hospital  Chronic diastolic CHF (congestive heart failure) (HCC) Elevated BNP with chest x-ray with mild bilateral interstitial infiltrates with small bilateral pleural effusions. She is negative about a liter, she appears euvolemic at this time. There was done which showed a preserved EF grade 2 diastolic heart failure no wall motion abnormalities.  Black stools: No significant chest pain, 1, repeat today, previous hemoglobin in 2017 was 13.  Diabetes mellitus type 2, insulin dependent (HCC) Uncontrolled significantly elevated likely contributing to her possible syncope.  Last A1c of 10.0 Blood glucose is improved, she is currently on long-acting insulin plus sliding scale she will probably need insulin from now going forward.  Cirrhosis: Abdominal ultrasound showed cirrhosis of the liver with focal hepatic lesions, follow-up with PCP as  an outpatient.   DVT prophylaxis: lovenox Family Communication:none Status is: Inpatient  Not inpatient appropriate.  Dispo: The patient is from: Home              Anticipated d/c is to: Home              Anticipated d/c date is: 1 day              Patient currently is not medically stable to d/c.  Code Status:     Code Status Orders  (From admission, onward)         Start     Ordered   03/29/20 1738  Full code  Continuous     03/29/20 1739        Code Status History    This patient has a current code status but no historical code status.   Advance Care Planning Activity        IV Access:    Peripheral IV   Procedures and diagnostic studies:   X-ray chest PA and lateral  Result Date: 03/29/2020 CLINICAL DATA:  64 year old female with syncope. EXAM: CHEST - 2 VIEW COMPARISON:  None. FINDINGS: Small bilateral pleural effusions, left greater right with associated bibasilar atelectasis. Pneumonia is not excluded. Clinical correlation is recommended. There is diffuse interstitial and interlobular septal prominence consistent with edema. There is no pneumothorax. Borderline cardiomegaly. Atherosclerotic calcification of the aorta. No acute osseous pathology. IMPRESSION: Findings of CHF and small bilateral pleural effusions. Pneumonia is not excluded. Clinical correlation is recommended. Electronically Signed   By: Anner Crete M.D.   On: 03/29/2020 18:51   ECHOCARDIOGRAM COMPLETE  Result Date: 03/30/2020    ECHOCARDIOGRAM REPORT   Patient Name:   Tracy Duke  Pautler Date of Exam: 03/30/2020 Medical Rec #:  [id number]       Height:       67.0 in Accession #:    [id number]      Weight:       235.0 lb Date of Birth:  [birth year]       BSA:          2.166 m Patient Age:    64 years        BP:           116/44 mmHg Patient Gender: F               HR:           92 bpm. Exam Location:  Inpatient Procedure: 2D Echo and Intracardiac Opacification Agent Indications:    Syncope   History:        Patient has no prior history of Echocardiogram examinations. No                 prior cardiac history.  Sonographer:    Leeroy Bock Turrentine Referring Phys: [id number] Emeline General  Sonographer Comments: Image acquisition challenging due to patient body habitus. IMPRESSIONS  1. Left ventricular ejection fraction, by estimation, is 55%. The left ventricle has low normal function. Left ventricular endocardial border not optimally defined to evaluate regional wall motion. There is moderate left ventricular hypertrophy. Left ventricular diastolic parameters are consistent with Grade II diastolic dysfunction (pseudonormalization).  2. Right ventricular systolic function is mildly reduced. The right ventricular size is normal. Tricuspid regurgitation signal is inadequate for assessing PA pressure.  3. The mitral valve is abnormal. Trivial mitral valve regurgitation. No evidence of mitral stenosis.  4. The aortic valve is abnormal. Aortic valve regurgitation is not visualized. Mild aortic valve stenosis. Aortic valve mean gradient measures 14.0 mmHg. FINDINGS  Left Ventricle: Left ventricular ejection fraction, by estimation, is 55%. The left ventricle has low normal function. Left ventricular endocardial border not optimally defined to evaluate regional wall motion. Definity contrast agent was given IV to delineate the left ventricular endocardial borders. The left ventricular internal cavity size was normal in size. There is moderate left ventricular hypertrophy. Left ventricular diastolic parameters are consistent with Grade II diastolic dysfunction (pseudonormalization). Right Ventricle: The right ventricular size is normal. No increase in right ventricular wall thickness. Right ventricular systolic function is mildly reduced. Tricuspid regurgitation signal is inadequate for assessing PA pressure. Left Atrium: Left atrial size was not well visualized. Right Atrium: Right atrial size was not well visualized.  Pericardium: The pericardium was not well visualized. Mitral Valve: The mitral valve is abnormal. Normal mobility of the mitral valve leaflets. Mild to moderate mitral annular calcification. Trivial mitral valve regurgitation. No evidence of mitral valve stenosis. Tricuspid Valve: The tricuspid valve is normal in structure. Tricuspid valve regurgitation is not demonstrated. No evidence of tricuspid stenosis. Aortic Valve: The aortic valve is abnormal. Aortic valve regurgitation is not visualized. Mild aortic stenosis is present. There is moderate calcification of the aortic valve. Aortic valve mean gradient measures 14.0 mmHg. Aortic valve peak gradient measures 21.3 mmHg. Aortic valve area, by VTI measures 1.37 cm. Pulmonic Valve: The pulmonic valve was not well visualized. Pulmonic valve regurgitation is trivial. No evidence of pulmonic stenosis. Aorta: The aortic root is normal in size and structure and the ascending aorta was not well visualized. Venous: The inferior vena cava was not well visualized. IAS/Shunts: The interatrial septum was not well visualized.  LEFT VENTRICLE PLAX  2D LVIDd:         4.30 cm  Diastology LVIDs:         3.20 cm  LV e' lateral:   5.33 cm/s LV PW:         1.30 cm  LV E/e' lateral: 26.6 LV IVS:        1.30 cm  LV e' medial:    5.55 cm/s LVOT diam:     1.90 cm  LV E/e' medial:  25.6 LV SV:         65 LV SV Index:   30 LVOT Area:     2.84 cm  RIGHT VENTRICLE RV S prime:     9.68 cm/s TAPSE (M-mode): 2.0 cm LEFT ATRIUM             Index       RIGHT ATRIUM           Index LA diam:        4.00 cm 1.85 cm/m  RA Area:     17.10 cm LA Vol (A2C):   64.3 ml 29.69 ml/m RA Volume:   49.00 ml  22.62 ml/m LA Vol (A4C):   42.3 ml 19.53 ml/m LA Biplane Vol: 52.5 ml 24.24 ml/m  AORTIC VALVE AV Area (Vmax):    1.55 cm AV Area (Vmean):   1.46 cm AV Area (VTI):     1.37 cm AV Vmax:           231.00 cm/s AV Vmean:          178.750 cm/s AV VTI:            0.475 m AV Peak Grad:      21.3 mmHg AV  Mean Grad:      14.0 mmHg LVOT Vmax:         126.00 cm/s LVOT Vmean:        92.300 cm/s LVOT VTI:          0.229 m LVOT/AV VTI ratio: 0.48  AORTA Ao Root diam: 2.30 cm MITRAL VALVE MV Area (PHT): 3.12 cm     SHUNTS MV Decel Time: 243 msec     Systemic VTI:  0.23 m MV E velocity: 142.00 cm/s  Systemic Diam: 1.90 cm MV A velocity: 107.00 cm/s MV E/A ratio:  1.33 Weston Brass MD Electronically signed by Weston Brass MD Signature Date/Time: 03/30/2020/2:18:22 PM    Final    US Abdomen Limited RUQ  Result Date: 03/29/2020 CLINICAL DATA:  Cirrhosis EXAM: ULTRASOUND ABDOMEN LIMITED RIGHT UPPER QUADRANT COMPARISON:  CT 03/29/2020 FINDINGS: Gallbladder: Surgically absent Common bile duct: Diameter: Up to 4.7 mm Liver: Coarse nodular echotexture without dominant mass. Portal vein is patent on color Doppler imaging with normal direction of blood flow towards the liver. Other: None. IMPRESSION: 1. Status post cholecystectomy.  Negative for biliary dilatation 2. Liver cirrhosis without focal hepatic abnormality by sonography. Electronically Signed   By: Jasmine Pang M.D.   On: 03/29/2020 22:55     Medical Consultants:    None.  Anti-Infectives:   None  Subjective:    Bing Plume no chest pain.  Objective:    Vitals:   03/31/20 0138 03/31/20 0601 03/31/20 0815 03/31/20 0816  BP: (!) 114/54 121/67 (!) 127/48   Pulse: 84 86 87 86  Resp: 20 20 17    Temp: 98.5 F (36.9 C) 98.2 F (36.8 C)  98.4 F (36.9 C)  TempSrc: Oral Oral  Oral  SpO2: 96% 93% 97%  98%  Weight:  104.1 kg    Height:       SpO2: 98 %   Intake/Output Summary (Last 24 hours) at 03/31/2020 0948 Last data filed at 03/31/2020 0138 Gross per 24 hour  Intake 483 ml  Output 1700 ml  Net -1217 ml   Filed Weights   03/29/20 2003 03/30/20 0005 03/31/20 0601  Weight: 106.6 kg 106.6 kg 104.1 kg    Exam: General exam: In no acute distress. Respiratory system: Good air movement and clear to auscultation.  Cardiovascular system: S1 & S2 heard, RRR.  Gastrointestinal system: Abdomen is nondistended, soft and nontender.  Extremities: No pedal edema. Skin: No rashes, lesions or ulcers  Data Reviewed:    Labs: Basic Metabolic Panel: Recent Labs  Lab 03/29/20 1758 03/29/20 1758 03/30/20 0334 03/31/20 0457  NA 129*  --  131* 135  K 4.8   < > 3.8 4.6  CL 97*  --  97* 100  CO2 22  --  22 25  GLUCOSE 417*  --  312* 251*  BUN 22  --  23 16  CREATININE 1.01*  --  1.12* 1.02*  CALCIUM 8.6*  --  8.7* 8.8*  MG 1.9  --   --   --    < > = values in this interval not displayed.   GFR Estimated Creatinine Clearance: 67.3 mL/min (A) (by C-G formula based on SCr of 1.02 mg/dL (H)). Liver Function Tests: Recent Labs  Lab 03/29/20 1931 03/31/20 0457  AST 28 30  ALT 22 21  ALKPHOS 86 84  BILITOT 1.2 1.3*  PROT 6.3* 6.4*  ALBUMIN 2.6* 2.6*   No results for input(s): LIPASE, AMYLASE in the last 168 hours. No results for input(s): AMMONIA in the last 168 hours. Coagulation profile Recent Labs  Lab 03/29/20 1931  INR 1.5*   COVID-19 Labs  Recent Labs    03/29/20 1931  FERRITIN 231    Lab Results  Component Value Date   SARSCOV2NAA NEGATIVE 03/30/2020    CBC: Recent Labs  Lab 03/29/20 1758  WBC 8.7  HGB 11.1*  HCT 33.2*  MCV 90.7  PLT 117*   Cardiac Enzymes: No results for input(s): CKTOTAL, CKMB, CKMBINDEX, TROPONINI in the last 168 hours. BNP (last 3 results) No results for input(s): PROBNP in the last 8760 hours. CBG: Recent Labs  Lab 03/30/20 0553 03/30/20 1143 03/30/20 1614 03/30/20 2120 03/31/20 0652  GLUCAP 283* 278* 207* 256* 233*   D-Dimer: No results for input(s): DDIMER in the last 72 hours. Hgb A1c: Recent Labs    03/30/20 0334  HGBA1C 10.2*   Lipid Profile: No results for input(s): CHOL, HDL, LDLCALC, TRIG, CHOLHDL, LDLDIRECT in the last 72 hours. Thyroid function studies: No results for input(s): TSH, T4TOTAL, T3FREE, THYROIDAB in the  last 72 hours.  Invalid input(s): FREET3 Anemia work up: Recent Labs    03/29/20 1931  FERRITIN 231  TIBC 269  IRON 29   Sepsis Labs: Recent Labs  Lab 03/29/20 1758  WBC 8.7   Microbiology Recent Results (from the past 240 hour(s))  MRSA PCR Screening     Status: None   Collection Time: 03/30/20 12:28 PM   Specimen: Nasopharyngeal Swab  Result Value Ref Range Status   MRSA by PCR NEGATIVE NEGATIVE Final    Comment:        The GeneXpert MRSA Assay (FDA approved for NASAL specimens only), is one component of a comprehensive MRSA colonization surveillance program. It is not  intended to diagnose MRSA infection nor to guide or monitor treatment for MRSA infections. Performed at [facility], [address] [place], [place] [address]   SARS CORONAVIRUS 2 (TAT 6-24 HRS) Nasopharyngeal Nasopharyngeal Swab     Status: None   Collection Time: 03/30/20 12:28 PM   Specimen: Nasopharyngeal Swab  Result Value Ref Range Status   SARS Coronavirus 2 NEGATIVE NEGATIVE Final    Comment: (NOTE) SARS-CoV-2 target nucleic acids are NOT DETECTED. The SARS-CoV-2 RNA is generally detectable in upper and lower respiratory specimens during the acute phase of infection. Negative results do not preclude SARS-CoV-2 infection, do not rule out co-infections with other pathogens, and should not be used as the sole basis for treatment or other patient management decisions. Negative results must be combined with clinical observations, patient history, and epidemiological information. The expected result is Negative. Fact Sheet for Patients: HairSlick.no Fact Sheet for Healthcare Providers: quierodirigir.com This test is not yet approved or cleared by the [place] FDA and  has been authorized for detection and/or diagnosis of SARS-CoV-2 by FDA under an Emergency Use Authorization (EUA). This EUA will remain  in effect (meaning  this test can be used) for the duration of the COVID-19 declaration under Section 56 4(b)(1) of the Act, 21 U.S.C. section 360bbb-3(b)(1), unless the authorization is terminated or revoked sooner. Performed at [facility], [address] [place], [place] [address]      Medications:   . calcium-vitamin D  2 tablet Oral Daily  . enoxaparin (LOVENOX) injection  0.5 mg/kg Subcutaneous Q24H  . ferrous sulfate  300 mg Oral Daily  . insulin aspart  0-15 Units Subcutaneous TID WC  . insulin glargine  70 Units Subcutaneous Q2200  . multivitamin with minerals  1 tablet Oral Daily  . sodium chloride flush  3 mL Intravenous Q12H   Continuous Infusions:    LOS: 1 day   Marinda Elk  [facility]  03/31/2020, 9:48 AM

## 2020-03-31 NOTE — Progress Notes (Signed)
Progress Note  Patient Name: Tracy Duke Date of Encounter: 03/31/2020  Primary Cardiologist: New--requests [place]  Subjective   Seen after nuclear stress test. She is sore from the hard table but otherwise doing well. No chest pain, no shortness of breath, no dizziness/lightheadedness/syncope. Family endorses that she had a fever at urgent care but hasn't had anything since.   Inpatient Medications    Scheduled Meds: . regadenoson      . calcium-vitamin D  2 tablet Oral Daily  . enoxaparin (LOVENOX) injection  0.5 mg/kg Subcutaneous Q24H  . ferrous sulfate  300 mg Oral Daily  . insulin aspart  0-15 Units Subcutaneous TID WC  . insulin glargine  70 Units Subcutaneous Q2200  . multivitamin with minerals  1 tablet Oral Daily  . sodium chloride flush  3 mL Intravenous Q12H   Continuous Infusions:  PRN Meds: acetaminophen, docusate sodium, loratadine, ondansetron   Vital Signs    Vitals:   03/31/20 0957 03/31/20 1004 03/31/20 1005 03/31/20 1007  BP: (!) 157/55 (!) 168/61 (!) 161/46 (!) 157/46  Pulse:      Resp:      Temp:      TempSrc:      SpO2:      Weight:      Height:        Intake/Output Summary (Last 24 hours) at 03/31/2020 1052 Last data filed at 03/31/2020 0138 Gross per 24 hour  Intake 483 ml  Output 1700 ml  Net -1217 ml   Last 3 Weights 03/31/2020 03/30/2020 03/29/2020  Weight (lbs) 229 lb 9.6 oz 235 lb 0.2 oz 235 lb  Weight (kg) 104.146 kg 106.6 kg 106.595 kg      Telemetry    NSR - Personally Reviewed  ECG    NSR, RBBB - Personally Reviewed  Physical Exam   GEN: No acute distress.   Neck: No JVD Cardiac: RRR, no rubs or gallops. 2/6 SEM Respiratory: Clear to auscultation bilaterally. GI: Soft, nontender, non-distended  MS: trivial bilateral LE edema; No deformity. Neuro:  Nonfocal  Psych: Normal affect   Labs    High Sensitivity Troponin:   Recent Labs  Lab 03/29/20 1758 03/29/20 1931  TROPONINIHS 261* 249*       Chemistry Recent Labs  Lab 03/29/20 1758 03/29/20 1931 03/30/20 0334 03/31/20 0457  NA 129*  --  131* 135  K 4.8  --  3.8 4.6  CL 97*  --  97* 100  CO2 22  --  22 25  GLUCOSE 417*  --  312* 251*  BUN 22  --  23 16  CREATININE 1.01*  --  1.12* 1.02*  CALCIUM 8.6*  --  8.7* 8.8*  PROT  --  6.3*  --  6.4*  ALBUMIN  --  2.6*  --  2.6*  AST  --  28  --  30  ALT  --  22  --  21  ALKPHOS  --  86  --  84  BILITOT  --  1.2  --  1.3*  GFRNONAA 58*  --  51* 57*  GFRAA >60  --  59* >60  ANIONGAP 10  --  12 10     Hematology Recent Labs  Lab 03/29/20 1758  WBC 8.7  RBC 3.66*  HGB 11.1*  HCT 33.2*  MCV 90.7  MCH 30.3  MCHC 33.4  RDW 13.8  PLT 117*    BNPNo results for input(s): BNP, PROBNP in the last 168 hours.  DDimer No results for input(s): DDIMER in the last 168 hours.   Radiology    X-ray chest PA and lateral  Result Date: 03/29/2020 CLINICAL DATA:  64 year old female with syncope. EXAM: CHEST - 2 VIEW COMPARISON:  None. FINDINGS: Small bilateral pleural effusions, left greater right with associated bibasilar atelectasis. Pneumonia is not excluded. Clinical correlation is recommended. There is diffuse interstitial and interlobular septal prominence consistent with edema. There is no pneumothorax. Borderline cardiomegaly. Atherosclerotic calcification of the aorta. No acute osseous pathology. IMPRESSION: Findings of CHF and small bilateral pleural effusions. Pneumonia is not excluded. Clinical correlation is recommended. Electronically Signed   By: Elgie Collard M.D.   On: 03/29/2020 18:51   ECHOCARDIOGRAM COMPLETE  Result Date: 03/30/2020    ECHOCARDIOGRAM REPORT   Patient Name:   [name] Date of Exam: 03/30/2020 Medical Rec #:  [id number]       Height:       67.0 in Accession #:    [id number]      Weight:       235.0 lb Date of Birth:  1956-04-09       BSA:          2.166 m Patient Age:    64 years        BP:           116/44 mmHg Patient Gender: F                HR:           92 bpm. Exam Location:  Inpatient Procedure: 2D Echo and Intracardiac Opacification Agent Indications:    Syncope  History:        Patient has no prior history of Echocardiogram examinations. No                 prior cardiac history.  Sonographer:    Leeroy Bock Turrentine Referring Phys: [id number] [place] [facility]  Sonographer Comments: Image acquisition challenging due to patient body habitus. IMPRESSIONS  1. Left ventricular ejection fraction, by estimation, is 55%. The left ventricle has low normal function. Left ventricular endocardial border not optimally defined to evaluate regional wall motion. There is moderate left ventricular hypertrophy. Left ventricular diastolic parameters are consistent with Grade II diastolic dysfunction (pseudonormalization).  2. Right ventricular systolic function is mildly reduced. The right ventricular size is normal. Tricuspid regurgitation signal is inadequate for assessing PA pressure.  3. The mitral valve is abnormal. Trivial mitral valve regurgitation. No evidence of mitral stenosis.  4. The aortic valve is abnormal. Aortic valve regurgitation is not visualized. Mild aortic valve stenosis. Aortic valve mean gradient measures 14.0 mmHg. FINDINGS  Left Ventricle: Left ventricular ejection fraction, by estimation, is 55%. The left ventricle has low normal function. Left ventricular endocardial border not optimally defined to evaluate regional wall motion. Definity contrast agent was given IV to delineate the left ventricular endocardial borders. The left ventricular internal cavity size was normal in size. There is moderate left ventricular hypertrophy. Left ventricular diastolic parameters are consistent with Grade II diastolic dysfunction (pseudonormalization). Right Ventricle: The right ventricular size is normal. No increase in right ventricular wall thickness. Right ventricular systolic function is mildly reduced. Tricuspid regurgitation signal is inadequate for  assessing PA pressure. Left Atrium: Left atrial size was not well visualized. Right Atrium: Right atrial size was not well visualized. Pericardium: The pericardium was not well visualized. Mitral Valve: The mitral valve is abnormal. Normal mobility of the mitral valve leaflets. Mild to moderate  mitral annular calcification. Trivial mitral valve regurgitation. No evidence of mitral valve stenosis. Tricuspid Valve: The tricuspid valve is normal in structure. Tricuspid valve regurgitation is not demonstrated. No evidence of tricuspid stenosis. Aortic Valve: The aortic valve is abnormal. Aortic valve regurgitation is not visualized. Mild aortic stenosis is present. There is moderate calcification of the aortic valve. Aortic valve mean gradient measures 14.0 mmHg. Aortic valve peak gradient measures 21.3 mmHg. Aortic valve area, by VTI measures 1.37 cm. Pulmonic Valve: The pulmonic valve was not well visualized. Pulmonic valve regurgitation is trivial. No evidence of pulmonic stenosis. Aorta: The aortic root is normal in size and structure and the ascending aorta was not well visualized. Venous: The inferior vena cava was not well visualized. IAS/Shunts: The interatrial septum was not well visualized.  LEFT VENTRICLE PLAX 2D LVIDd:         4.30 cm  Diastology LVIDs:         3.20 cm  LV e' lateral:   5.33 cm/s LV PW:         1.30 cm  LV E/e' lateral: 26.6 LV IVS:        1.30 cm  LV e' medial:    5.55 cm/s LVOT diam:     1.90 cm  LV E/e' medial:  25.6 LV SV:         65 LV SV Index:   30 LVOT Area:     2.84 cm  RIGHT VENTRICLE RV S prime:     9.68 cm/s TAPSE (M-mode): 2.0 cm LEFT ATRIUM             Index       RIGHT ATRIUM           Index LA diam:        4.00 cm 1.85 cm/m  RA Area:     17.10 cm LA Vol (A2C):   64.3 ml 29.69 ml/m RA Volume:   49.00 ml  22.62 ml/m LA Vol (A4C):   42.3 ml 19.53 ml/m LA Biplane Vol: 52.5 ml 24.24 ml/m  AORTIC VALVE AV Area (Vmax):    1.55 cm AV Area (Vmean):   1.46 cm AV Area (VTI):      1.37 cm AV Vmax:           231.00 cm/s AV Vmean:          178.750 cm/s AV VTI:            0.475 m AV Peak Grad:      21.3 mmHg AV Mean Grad:      14.0 mmHg LVOT Vmax:         126.00 cm/s LVOT Vmean:        92.300 cm/s LVOT VTI:          0.229 m LVOT/AV VTI ratio: 0.48  AORTA Ao Root diam: 2.30 cm MITRAL VALVE MV Area (PHT): 3.12 cm     SHUNTS MV Decel Time: 243 msec     Systemic VTI:  0.23 m MV E velocity: 142.00 cm/s  Systemic Diam: 1.90 cm MV A velocity: 107.00 cm/s MV E/A ratio:  1.33 Cherlynn Kaiser MD Electronically signed by Cherlynn Kaiser MD Signature Date/Time: 03/30/2020/2:18:22 PM    Final    US Abdomen Limited RUQ  Result Date: 03/29/2020 CLINICAL DATA:  Cirrhosis EXAM: ULTRASOUND ABDOMEN LIMITED RIGHT UPPER QUADRANT COMPARISON:  CT 03/29/2020 FINDINGS: Gallbladder: Surgically absent Common bile duct: Diameter: Up to 4.7 mm Liver: Coarse nodular echotexture without dominant mass. Portal  vein is patent on color Doppler imaging with normal direction of blood flow towards the liver. Other: None. IMPRESSION: 1. Status post cholecystectomy.  Negative for biliary dilatation 2. Liver cirrhosis without focal hepatic abnormality by sonography. Electronically Signed   By: Jasmine Pang M.D.   On: 03/29/2020 22:55    Cardiac Studies   Echo 03/30/20 1. Left ventricular ejection fraction, by estimation, is 55%. The left  ventricle has low normal function. Left ventricular endocardial border not  optimally defined to evaluate regional wall motion. There is moderate left  ventricular hypertrophy. Left  ventricular diastolic parameters are consistent with Grade II diastolic  dysfunction (pseudonormalization).  2. Right ventricular systolic function is mildly reduced. The right  ventricular size is normal. Tricuspid regurgitation signal is inadequate  for assessing PA pressure.  3. The mitral valve is abnormal. Trivial mitral valve regurgitation. No  evidence of mitral stenosis.  4. The aortic  valve is abnormal. Aortic valve regurgitation is not  visualized. Mild aortic valve stenosis. Aortic valve mean gradient  measures 14.0 mmHg.   Patient Profile     64 y.o. female with PMH type 2 diabetes, hypertension, cirrhosis, obesity who presented with syncope.  Assessment & Plan    Syncope: -elevated troponin not consistent with ACS -EF normal, no severe valve disease -pending nuclear stress test result today. Unless large area of ischemia, would be ok for discharge with outpatient follow up. -telemetry without arrhythmia or pause -ECG only with RBBB -if cardiac workup unremarkable, suspect this is due to noncardiac cause such as metabolic derangement. Also has history of seizure, EEG pending. She also endorses fever at the time, though this has not recurred.  Hypertension -on lisinopril 20 mg daily as an outpatient, held this admission -blood pressures have been labile, most recently 102/53. Continue to hold.  Type II diabetes: -per primary team  [facility] will sign off, pending final read of nuclear stress test.   Medication Recommendations: no changes to medications recommended Other recommendations (labs, testing, etc):  none Follow up as an outpatient: We have requested outpatient follow up in [place] per patient request.  For questions or updates, please contact [facility] Please consult [email or web address] for contact info under        Signed, Jodelle Red, MD  03/31/2020, 10:52 AM

## 2020-03-31 NOTE — Progress Notes (Signed)
   Tracy Duke presented for a nuclear stress test today, test proctored by nuc med staff - was designated low risk pre-test so APP did not proctor.  Per nuc med staff, patient remained asymptomatic and had no immediate complications.  Stress imaging is pending at this time.  Preliminary EKG findings may be listed in the chart, but the stress test result will not be finalized until perfusion imaging is complete.  Laurann Montana, PA-C 03/31/2020, 11:42 AM

## 2020-03-31 NOTE — Procedures (Signed)
Patient Name: Tracy Duke  MRN: [id number]  Epilepsy Attending: Charlsie Quest  Referring Physician/Provider: Dr Mikey College Date: 03/31/2020 Duration: 23 mins  Patient history: 64 year old female with remote history of seizures presented with syncope.  EEG to evaluate for seizures.  Level of alertness: Awake, drowsy  AEDs during EEG study: None  Technical aspects: This EEG study was done with scalp electrodes positioned according to the 10-20 International system of electrode placement. Electrical activity was acquired at a sampling rate of 500Hz  and reviewed with a high frequency filter of 70Hz  and a low frequency filter of 1Hz . EEG data were recorded continuously and digitally stored.   Description: The posterior dominant rhythm consists of 7.5 Hz activity of moderate voltage (25-35 uV) seen predominantly in posterior head regions, symmetric and reactive to eye opening and eye closing.          Drowsiness was characterized by attenuation of the posterior background rhythm. Hyperventilation and photic stimulation were not performed.  IMPRESSION: This study is within normal limits. No seizures or epileptiform discharges were seen throughout the recording.  Tracy Duke 

## 2020-03-31 NOTE — Progress Notes (Signed)
I had about a 20-minute long conversation with the patient about the results of the stress test, and explained to her that I am considering as it show signs of ischemia, have explained the risk and benefits of a cardiac cath she relates she is not staying in the hospital another day she is leaving at 9:00. The patient did explain back to me the consequences of leaving AGAINST MEDICAL ADVICE in the setting of a syncopal episode with the stress test result showing wall motion abnormality, she knows she could die have a heart attack or passed out again and she relates she will face the consequences but she is leaving at 9 PM.  The patient has the capacity to make her own decisions.

## 2020-03-31 NOTE — Progress Notes (Signed)
Pt says she is ok and her heart is ok. She said she will leave by 9 pm AMA if needed. Paged MD to give her call and discus with patient.

## 2020-04-01 LAB — HEMOGLOBIN A1C
Hgb A1c MFr Bld: 9.9 % — ABNORMAL HIGH (ref 4.8–5.6)
Mean Plasma Glucose: 237 mg/dL

## 2020-04-01 NOTE — Telephone Encounter (Addendum)
Attempted to call the pt this morning but 3 of her numbers listed say the "number cannot completed as dialed". There is no DPR on file to call a next of kin.. it appears Adrianne with Dr. Tomie China has also been trying to call the pt but made her an appt with him for post hospital  04/15/20 and has mailed her a calendar with the appt information.

## 2020-04-02 ENCOUNTER — Telehealth: Payer: Self-pay | Admitting: *Deleted

## 2020-04-02 ENCOUNTER — Encounter: Payer: Self-pay | Admitting: *Deleted

## 2020-04-02 NOTE — Telephone Encounter (Signed)
Patient enrolled for [employer] to ship 30 day cardiac event monitor to the land address of [address] [place] , [place] [address].  Patient called at [phone].  No answer, no voicemail set up.  Other listed Home/Cell not in service.

## 2020-04-05 ENCOUNTER — Ambulatory Visit (INDEPENDENT_AMBULATORY_CARE_PROVIDER_SITE_OTHER): Payer: Medicare PPO

## 2020-04-05 DIAGNOSIS — R55 Syncope and collapse: Secondary | ICD-10-CM | POA: Diagnosis not present

## 2020-04-09 NOTE — Discharge Summary (Signed)
Physician Discharge Summary  Tracy Duke WGN:[id number] DOB: 04/18/1956 DOA: 03/29/2020  PCP: Patient, No Pcp Per  Admit date: 03/29/2020 Discharge date: 04/09/2020  Admitted From: Home Disposition:  Home        Left AMA Recommendations for Outpatient Follow-up:  1. Follow up with PCP in 1-2 weeks 2. Please obtain BMP/CBC in one week   Home Health:No Equipment/Devices:None Discharge Condition:Stable CODE STATUS:Full Diet recommendation: Heart Healthy   Brief/Interim Summary: 64 y.o. female past medical history significant for insulin-dependent diabetes mellitus, essential hypertension cirrhosis remote history of seizures presents with a syncopal episode that happened 4 days prior to admission while she was sitting in the couch she relates loss of consciousness and denies any prodromal symptoms.  Is nearly out for an hour feeling weak.    Discharge Diagnoses:  Active Problems:   Syncope   Chronic diastolic CHF (congestive heart failure) (HCC)   Diabetes mellitus type 2, insulin dependent (HCC) Elevated troponins/possible syncope: Biomarkers were mildly elevated, repeated blood glucose was extremely elevated cardiology was consulted recommended a stress test that was done that showed wall motion defect. The patient decided to leave AGAINST MEDICAL ADVICE despite explaining the risk and benefit of doing so. We will inform cardiology and they will call her at home to try to schedule cath as an outpatient.  Chronic diastolic heart failure: BNP was elevated, chest x-ray showed bilateral pulmonary infiltrates with small pleural effusion. Echo was done that showed grade 2 diastolic heart failure.  Black stools: She denies any chest pain.  Discharge Instructions   Allergies as of 03/31/2020      Reactions   Penicillins Shortness Of Breath, Swelling   Codeine Other (See Comments)   Cause seizures      Medication List    ASK your doctor about these medications   Alive  Womens 50+ Tabs Take 1 tablet by mouth daily.   Calcium Plus Vitamin D3 600-800 MG-UNIT Tabs Generic drug: Calcium Carb-Cholecalciferol Take 1 tablet by mouth daily.   docusate sodium 100 MG capsule Commonly known as: Colace Take 1 capsule (100 mg total) by mouth daily as needed for mild constipation.   glipiZIDE 10 MG 24 hr tablet Commonly known as: GLUCOTROL XL Take 10 mg by mouth daily with breakfast.   GLUCOSAMINE CHONDROITIN JOINT PO Take 1 tablet by mouth at bedtime.   Lantus SoloStar 100 UNIT/ML Solostar Pen Generic drug: insulin glargine Inject 62 Units into the skin daily at 10 pm.   lisinopril 20 MG tablet Commonly known as: ZESTRIL Take 20 mg by mouth daily.   ondansetron 4 MG disintegrating tablet Commonly known as: Zofran ODT Take 1 tablet (4 mg total) by mouth every 8 (eight) hours as needed for nausea or vomiting.   sitaGLIPtin 100 MG tablet Commonly known as: JANUVIA Take 100 mg by mouth daily.       Allergies  Allergen Reactions  . Penicillins Shortness Of Breath and Swelling  . Codeine Other (See Comments)    Cause seizures    Consultations:  Cardiology   Procedures/Studies: X-ray chest PA and lateral  Result Date: 03/29/2020 CLINICAL DATA:  64 year old female with syncope. EXAM: CHEST - 2 VIEW COMPARISON:  None. FINDINGS: Small bilateral pleural effusions, left greater right with associated bibasilar atelectasis. Pneumonia is not excluded. Clinical correlation is recommended. There is diffuse interstitial and interlobular septal prominence consistent with edema. There is no pneumothorax. Borderline cardiomegaly. Atherosclerotic calcification of the aorta. No acute osseous pathology. IMPRESSION: Findings of CHF and small  bilateral pleural effusions. Pneumonia is not excluded. Clinical correlation is recommended. Electronically Signed   By: Elgie Collard M.D.   On: 03/29/2020 18:51   NM Myocar Multi W/Spect Izetta Dakin Motion / EF  Result Date:  03/31/2020 CLINICAL DATA:  Chest pain.  Hypertension.  Diabetes. EXAM: MYOCARDIAL IMAGING WITH SPECT (REST AND PHARMACOLOGIC-STRESS) GATED LEFT VENTRICULAR WALL MOTION STUDY LEFT VENTRICULAR EJECTION FRACTION TECHNIQUE: Standard myocardial SPECT imaging was performed after resting intravenous injection of 10.5 mCi Tc-61m Myoview. Subsequently, intravenous infusion of Lexiscan was performed under the supervision of the Cardiology staff. At peak effect of the drug, 32.8 mCi Tc-18m Myoview was injected intravenously and standard myocardial SPECT imaging was performed. Quantitative gated imaging was also performed to evaluate left ventricular wall motion, and estimate left ventricular ejection fraction. COMPARISON:  None. FINDINGS: Perfusion: Reduced activity in the cardiac base on stress images compared to rest images particularly in the lateral wall and septum, compatible with moderate areas of inducible ischemia. Apical scarring. Wall Motion: Mild hypokinesis in the lateral wall at the base. Left Ventricular Ejection Fraction: 54 % End diastolic volume 124 ml (moderately increased) End systolic volume 57 ml (mildly increased) IMPRESSION: 1. Moderate regions of inducible ischemia along the lateral and septal walls at the cardiac base. Matched scarring along the cardiac apex. 2. Mild lateral wall hypokinesis particularly at the base. 3. Left ventricular ejection fraction 54% 4. Non invasive risk stratification*: High (due to having two moderate regions of inducible ischemia along with moderate end-diastolic left ventricular dilatation). *2012 Appropriate Use Criteria for Coronary Revascularization Focused Update: J Am Coll Cardiol. 2012;59(9):857-881. http://content.dementiazones.com.aspx?articleid=[email or web address] Electronically Signed   By: Gaylyn Rong M.D.   On: 03/31/2020 16:57   EEG adult  Result Date: 03/31/2020 Charlsie Quest, MD     03/31/2020  4:51 PM Patient Name: Tracy Duke MRN: [id number]  Epilepsy Attending: Charlsie Quest Referring Physician/Provider: Dr Mikey College Date: 03/31/2020 Duration: 23 mins Patient history: 64 year old female with remote history of seizures presented with syncope.  EEG to evaluate for seizures. Level of alertness: Awake, drowsy AEDs during EEG study: None Technical aspects: This EEG study was done with scalp electrodes positioned according to the 10-20 International system of electrode placement. Electrical activity was acquired at a sampling rate of 500Hz  and reviewed with a high frequency filter of 70Hz  and a low frequency filter of 1Hz . EEG data were recorded continuously and digitally stored. Description: The posterior dominant rhythm consists of 7.5 Hz activity of moderate voltage (25-35 uV) seen predominantly in posterior head regions, symmetric and reactive to eye opening and eye closing.         Drowsiness was characterized by attenuation of the posterior background rhythm. Hyperventilation and photic stimulation were not performed. IMPRESSION: This study is within normal limits. No seizures or epileptiform discharges were seen throughout the recording.   ECHOCARDIOGRAM COMPLETE  Result Date: 03/30/2020    ECHOCARDIOGRAM REPORT   Patient Name:   Tracy Duke Date of Exam: 03/30/2020 Medical Rec #:  05/30/2020       Height:       67.0 in Accession #:    Tracy Duke      Weight:       235.0 lb Date of Birth:  August 27, 1956       BSA:          2.166 m Patient Age:    64 years        BP:  116/44 mmHg Patient Gender: F               HR:           92 bpm. Exam Location:  Inpatient Procedure: 2D Echo and Intracardiac Opacification Agent Indications:    Syncope  History:        Patient has no prior history of Echocardiogram examinations. No                 prior cardiac history.  Sonographer:    Leeroy Bock Turrentine Referring Phys: [id number] [facility]  Sonographer Comments: Image acquisition challenging due to patient body habitus. IMPRESSIONS  1.  Left ventricular ejection fraction, by estimation, is 55%. The left ventricle has low normal function. Left ventricular endocardial border not optimally defined to evaluate regional wall motion. There is moderate left ventricular hypertrophy. Left ventricular diastolic parameters are consistent with Grade II diastolic dysfunction (pseudonormalization).  2. Right ventricular systolic function is mildly reduced. The right ventricular size is normal. Tricuspid regurgitation signal is inadequate for assessing PA pressure.  3. The mitral valve is abnormal. Trivial mitral valve regurgitation. No evidence of mitral stenosis.  4. The aortic valve is abnormal. Aortic valve regurgitation is not visualized. Mild aortic valve stenosis. Aortic valve mean gradient measures 14.0 mmHg. FINDINGS  Left Ventricle: Left ventricular ejection fraction, by estimation, is 55%. The left ventricle has low normal function. Left ventricular endocardial border not optimally defined to evaluate regional wall motion. Definity contrast agent was given IV to delineate the left ventricular endocardial borders. The left ventricular internal cavity size was normal in size. There is moderate left ventricular hypertrophy. Left ventricular diastolic parameters are consistent with Grade II diastolic dysfunction (pseudonormalization). Right Ventricle: The right ventricular size is normal. No increase in right ventricular wall thickness. Right ventricular systolic function is mildly reduced. Tricuspid regurgitation signal is inadequate for assessing PA pressure. Left Atrium: Left atrial size was not well visualized. Right Atrium: Right atrial size was not well visualized. Pericardium: The pericardium was not well visualized. Mitral Valve: The mitral valve is abnormal. Normal mobility of the mitral valve leaflets. Mild to moderate mitral annular calcification. Trivial mitral valve regurgitation. No evidence of mitral valve stenosis. Tricuspid Valve: The  tricuspid valve is normal in structure. Tricuspid valve regurgitation is not demonstrated. No evidence of tricuspid stenosis. Aortic Valve: The aortic valve is abnormal. Aortic valve regurgitation is not visualized. Mild aortic stenosis is present. There is moderate calcification of the aortic valve. Aortic valve mean gradient measures 14.0 mmHg. Aortic valve peak gradient measures 21.3 mmHg. Aortic valve area, by VTI measures 1.37 cm. Pulmonic Valve: The pulmonic valve was not well visualized. Pulmonic valve regurgitation is trivial. No evidence of pulmonic stenosis. Aorta: The aortic root is normal in size and structure and the ascending aorta was not well visualized. Venous: The inferior vena cava was not well visualized. IAS/Shunts: The interatrial septum was not well visualized.  LEFT VENTRICLE PLAX 2D LVIDd:         4.30 cm  Diastology LVIDs:         3.20 cm  LV e' lateral:   5.33 cm/s LV PW:         1.30 cm  LV E/e' lateral: 26.6 LV IVS:        1.30 cm  LV e' medial:    5.55 cm/s LVOT diam:     1.90 cm  LV E/e' medial:  25.6 LV SV:  65 LV SV Index:   30 LVOT Area:     2.84 cm  RIGHT VENTRICLE RV S prime:     9.68 cm/s TAPSE (M-mode): 2.0 cm LEFT ATRIUM             Index       RIGHT ATRIUM           Index LA diam:        4.00 cm 1.85 cm/m  RA Area:     17.10 cm LA Vol (A2C):   64.3 ml 29.69 ml/m RA Volume:   49.00 ml  22.62 ml/m LA Vol (A4C):   42.3 ml 19.53 ml/m LA Biplane Vol: 52.5 ml 24.24 ml/m  AORTIC VALVE AV Area (Vmax):    1.55 cm AV Area (Vmean):   1.46 cm AV Area (VTI):     1.37 cm AV Vmax:           231.00 cm/s AV Vmean:          178.750 cm/s AV VTI:            0.475 m AV Peak Grad:      21.3 mmHg AV Mean Grad:      14.0 mmHg LVOT Vmax:         126.00 cm/s LVOT Vmean:        92.300 cm/s LVOT VTI:          0.229 m LVOT/AV VTI ratio: 0.48  AORTA Ao Root diam: 2.30 cm MITRAL VALVE MV Area (PHT): 3.12 cm     SHUNTS MV Decel Time: 243 msec     Systemic VTI:  0.23 m MV E velocity: 142.00  cm/s  Systemic Diam: 1.90 cm MV A velocity: 107.00 cm/s MV E/A ratio:  1.33 Cherlynn Kaiser MD Electronically signed by Cherlynn Kaiser MD Signature Date/Time: 03/30/2020/2:18:22 PM    Final    US Abdomen Limited RUQ  Result Date: 03/29/2020 CLINICAL DATA:  Cirrhosis EXAM: ULTRASOUND ABDOMEN LIMITED RIGHT UPPER QUADRANT COMPARISON:  CT 03/29/2020 FINDINGS: Gallbladder: Surgically absent Common bile duct: Diameter: Up to 4.7 mm Liver: Coarse nodular echotexture without dominant mass. Portal vein is patent on color Doppler imaging with normal direction of blood flow towards the liver. Other: None. IMPRESSION: 1. Status post cholecystectomy.  Negative for biliary dilatation 2. Liver cirrhosis without focal hepatic abnormality by sonography. Electronically Signed   By: Donavan Foil M.D.   On: 03/29/2020 22:55    (Echo, Carotid, EGD, Colonoscopy, ERCP)    Subjective:   Discharge Exam: Vitals:   03/31/20 1205 03/31/20 1626  BP: (!) 102/53 (!) 121/51  Pulse: 89 94  Resp: 20 17  Temp:  98.5 F (36.9 C)  SpO2: 100% 99%   Vitals:   03/31/20 1005 03/31/20 1007 03/31/20 1205 03/31/20 1626  BP: (!) 161/46 (!) 157/46 (!) 102/53 (!) 121/51  Pulse:   89 94  Resp:   20 17  Temp:    98.5 F (36.9 C)  TempSrc:    Oral  SpO2:   100% 99%  Weight:      Height:        General: Pt is alert, awake, not in acute distress Cardiovascular: RRR, S1/S2 +, no rubs, no gallops Respiratory: CTA bilaterally, no wheezing, no rhonchi Abdominal: Soft, NT, ND, bowel sounds + Extremities: no edema, no cyanosis    The results of significant diagnostics from this hospitalization (including imaging, microbiology, ancillary and laboratory) are listed below for reference.     Microbiology:  No results found for this or any previous visit (from the past 240 hour(s)).   Labs: BNP (last 3 results) No results for input(s): BNP in the last 8760 hours. Basic Metabolic Panel: No results for input(s): NA, K, CL,  CO2, GLUCOSE, BUN, CREATININE, CALCIUM, MG, PHOS in the last 168 hours. Liver Function Tests: No results for input(s): AST, ALT, ALKPHOS, BILITOT, PROT, ALBUMIN in the last 168 hours. No results for input(s): LIPASE, AMYLASE in the last 168 hours. No results for input(s): AMMONIA in the last 168 hours. CBC: No results for input(s): WBC, NEUTROABS, HGB, HCT, MCV, PLT in the last 168 hours. Cardiac Enzymes: No results for input(s): CKTOTAL, CKMB, CKMBINDEX, TROPONINI in the last 168 hours. BNP: Invalid input(s): POCBNP CBG: No results for input(s): GLUCAP in the last 168 hours. D-Dimer No results for input(s): DDIMER in the last 72 hours. Hgb A1c No results for input(s): HGBA1C in the last 72 hours. Lipid Profile No results for input(s): CHOL, HDL, LDLCALC, TRIG, CHOLHDL, LDLDIRECT in the last 72 hours. Thyroid function studies No results for input(s): TSH, T4TOTAL, T3FREE, THYROIDAB in the last 72 hours.  Invalid input(s): FREET3 Anemia work up No results for input(s): VITAMINB12, FOLATE, FERRITIN, TIBC, IRON, RETICCTPCT in the last 72 hours. Urinalysis No results found for: COLORURINE, APPEARANCEUR, LABSPEC, PHURINE, GLUCOSEU, HGBUR, BILIRUBINUR, KETONESUR, PROTEINUR, UROBILINOGEN, NITRITE, LEUKOCYTESUR Sepsis Labs Invalid input(s): PROCALCITONIN,  WBC,  LACTICIDVEN Microbiology No results found for this or any previous visit (from the past 240 hour(s)).   Time coordinating discharge: Over 30 minutes  SIGNED:   Marinda Elk, MD  [facility] 04/09/2020, 3:12 PM Pager   If 7PM-7AM, please contact night-coverage [email or web address] Password [email or web address]

## 2020-04-15 ENCOUNTER — Ambulatory Visit: Payer: Medicare PPO | Admitting: Cardiology

## 2020-05-07 ENCOUNTER — Telehealth: Payer: Self-pay

## 2020-05-07 NOTE — Telephone Encounter (Signed)
-----   Message from Laurann [place], [place] sent at 05/07/2020  8:54 AM EDT ----- Sending to triage due to hospital result. Please let pt know Dr. Tomie China reviewed her monitor and felt it was unremarkable. She did have episode of slow HR on 4/25 at 11:45am per my review but no symptoms associated with this (44bpm) - please make sure she doesn't remember any specific symptoms from around that date. Otherwise keep f/u with Dr. Elvera Lennox as planned 5/25.

## 2020-05-07 NOTE — Progress Notes (Unsigned)
The patient has been notified of the result and verbalized understanding.  All questions (if any) were answered. Leanord Hawking, RN 05/07/2020 9:06 AM

## 2020-05-07 NOTE — Telephone Encounter (Signed)
The patient has been notified of the result and verbalized understanding.  All questions (if any) were answered. Sheleigh R Fields, RN 05/07/2020 9:06 AM   

## 2020-05-13 ENCOUNTER — Encounter: Payer: Self-pay | Admitting: Cardiology

## 2020-05-13 ENCOUNTER — Other Ambulatory Visit: Payer: Self-pay

## 2020-05-13 ENCOUNTER — Ambulatory Visit: Payer: Medicare PPO | Admitting: Cardiology

## 2020-05-13 VITALS — BP 158/58 | HR 76 | Ht 67.0 in | Wt 221.0 lb

## 2020-05-13 DIAGNOSIS — I10 Essential (primary) hypertension: Secondary | ICD-10-CM | POA: Diagnosis not present

## 2020-05-13 DIAGNOSIS — I350 Nonrheumatic aortic (valve) stenosis: Secondary | ICD-10-CM

## 2020-05-13 DIAGNOSIS — E119 Type 2 diabetes mellitus without complications: Secondary | ICD-10-CM

## 2020-05-13 DIAGNOSIS — Z794 Long term (current) use of insulin: Secondary | ICD-10-CM

## 2020-05-13 NOTE — Patient Instructions (Signed)

## 2020-05-13 NOTE — Progress Notes (Signed)
Cardiology Office Note:    Date:  05/13/2020   ID:  Tracy Duke, DOB 23-May-1956, MRN [id number]  PCP:  Karene Fry Gerome Apley, PA-C  Cardiologist:  Garwin Brothers, MD   Referring MD: No ref. provider found    ASSESSMENT:    1. Essential hypertension   2. Mild aortic stenosis   3. Diabetes mellitus type 2, insulin dependent (HCC)    PLAN:    In order of problems listed above:  1. Secondary prevention stressed with the patient.  Importance of compliance with diet medication stressed and she vocalized understanding.  Her exercise pattern is good and ankle encouraged her to continue it. 2. Essential hypertension: Blood pressure stable and she checks her blood pressure at home and they are fine Mild aortic stenosis: I discussed echo findings with the patient at length and questions were answered to her satisfaction. History of syncope: Event monitor is unremarkable and I discussed findings with the patient at length and questions were answered to her satisfaction Diabetes mellitus: In view of this I told the patient that she needs to talk to her primary care physician about lipid checks and being on statin therapy.  Also in view of significantly uncontrolled diabetes I told her to seek the advice of diabetologist/endocrinologist and she will do so.  She is not on aspirin because of history of gastric ulcers and bleeding Patient will be seen in follow-up appointment in 3 months or earlier if the patient has any concerns   Medication Adjustments/Labs and Tests Ordered: Current medicines are reviewed at length with the patient today.  Concerns regarding medicines are outlined above.  No orders of the defined types were placed in this encounter.  No orders of the defined types were placed in this encounter.    Chief Complaint  Patient presents with  . Follow-up     History of Present Illness:    Tracy Duke is a 64 y.o. female.  Patient has past medical history of essential  hypertension and diabetes mellitus.  Her diabetes mellitus is significantly uncontrolled.  She denies any chest pain orthopnea or PND.  She is not on aspirin because history of gastric ulcers.  At the time of my evaluation, the patient is alert awake oriented and in no distress.  Patient exercises on a regular basis at least half an hour a day and her husband accompanies her for this visit.  Past Medical History:  Diagnosis Date  . Anemia   . Cervical polyp 09/23/2016  . Chronic diastolic CHF (congestive heart failure) (HCC) 03/31/2020  . Chronic nonalcoholic liver disease 04/01/2013  . Colonoscopy refused 11/19/2014  . Depression screening 02/19/2014   Formatting of this note might be different from the original. PHQ2 SCORE 0  02/19/14  . Diabetes mellitus type 2, insulin dependent (HCC) 03/31/2020  . Diabetes mellitus without complication (HCC)   . Essential hypertension 04/01/2013  . GERD (gastroesophageal reflux disease)   . Hypertension   . Idiopathic thrombocytopenia (HCC) 09/23/2016  . Irritable colon 04/01/2013  . Non morbid obesity due to excess calories 03/24/2016  . Personal history of MRSA (methicillin resistant Staphylococcus aureus) 04/01/2013  . PONV (postoperative nausea and vomiting)   . PUD (peptic ulcer disease) 04/03/2013  . Refused influenza vaccine 11/19/2014  . Refused pneumococcal vaccination 11/19/2014  . Seizures (HCC)    History of seizures twenty years ago  . Splenomegaly 09/23/2016  . Syncope 03/29/2020  . Thrombocytopenia (HCC) 04/01/2013  . Type II diabetes mellitus (HCC)  04/01/2013  . Uncontrolled type 2 diabetes mellitus without complication, with long-term current use of insulin 09/23/2016  . Upper GI bleed 04/01/2013    Past Surgical History:  Procedure Laterality Date  . BILATERAL CARPAL TUNNEL RELEASE    . CHOLECYSTECTOMY    . HYSTEROSCOPY WITH D & C N/A 10/15/2016   Procedure: DILATATION AND CURETTAGE /HYSTEROSCOPY/POLYPECTOMY;  Surgeon: Benjaman Kindler, MD;   Location: [facility] ORS;  Service: Gynecology;  Laterality: N/A;  . TUBAL LIGATION      Current Medications: Current Meds  Medication Sig  . Calcium Carb-Cholecalciferol (CALCIUM PLUS VITAMIN D3) 600-800 MG-UNIT TABS Take 1 tablet by mouth daily.  . Insulin Glargine (LANTUS SOLOSTAR) 100 UNIT/ML Solostar Pen Inject 62 Units into the skin daily at 10 pm.  . lisinopril (PRINIVIL,ZESTRIL) 20 MG tablet Take 20 mg by mouth daily.     Allergies:   Penicillins and Codeine   Social History   Socioeconomic History  . Marital status: Married    Spouse name: Not on file  . Number of children: Not on file  . Years of education: Not on file  . Highest education level: Not on file  Occupational History  . Not on file  Tobacco Use  . Smoking status: Never Smoker  . Smokeless tobacco: Never Used  Substance and Sexual Activity  . Alcohol use: No  . Drug use: No  . Sexual activity: Not on file  Other Topics Concern  . Not on file  Social History Narrative  . Not on file   Social Determinants of Health   Financial Resource Strain:   . Difficulty of Paying Living Expenses:   Food Insecurity:   . Worried About Charity fundraiser in the Last Year:   . Arboriculturist in the Last Year:   Transportation Needs:   . Film/video editor (Medical):   Marland Kitchen Lack of Transportation (Non-Medical):   Physical Activity:   . Days of Exercise per Week:   . Minutes of Exercise per Session:   Stress:   . Feeling of Stress :   Social Connections:   . Frequency of Communication with Friends and Family:   . Frequency of Social Gatherings with Friends and Family:   . Attends Religious Services:   . Active Member of Clubs or Organizations:   . Attends Archivist Meetings:   Marland Kitchen Marital Status:      Family History: The patient's family history includes Breast cancer in her mother.  ROS:   Please see the history of present illness.    All other systems reviewed and are  negative.  EKGs/Labs/Other Studies Reviewed:    The following studies were reviewed today: IMPRESSIONS    1. Left ventricular ejection fraction, by estimation, is 55%. The left  ventricle has low normal function. Left ventricular endocardial border not  optimally defined to evaluate regional wall motion. There is moderate left  ventricular hypertrophy. Left  ventricular diastolic parameters are consistent with Grade II diastolic  dysfunction (pseudonormalization).  2. Right ventricular systolic function is mildly reduced. The right  ventricular size is normal. Tricuspid regurgitation signal is inadequate  for assessing PA pressure.  3. The mitral valve is abnormal. Trivial mitral valve regurgitation. No  evidence of mitral stenosis.  4. The aortic valve is abnormal. Aortic valve regurgitation is not  visualized. Mild aortic valve stenosis. Aortic valve mean gradient  measures 14.0 mmHg.   Tracy Duke, DOB 1956-11-13, MRN [id number]  EVENT MONITOR REPORT:  Patient was monitored from 04/05/2020 to 05/01/2020. Indication:                    Syncope and collapse Ordering physician:  Garwin Brothers, MD  Referring physician:        Garwin Brothers, MD    Baseline rhythm: Sinus  Minimum heart rate: 44 BPM.  Average heart rate on the baseline strip was 77 bpm.  Maximal heart rate 137 BPM.  Atrial arrhythmia: None significant rare brief atrial runs  Ventricular arrhythmia: None significant  Conduction abnormality: None significant  Symptoms: None significant   Conclusion:  Event monitoring was unremarkable.  Interpreting  cardiologist: Garwin Brothers, MD  Date: 05/06/2020 12:34 PM   Recent Labs: 03/29/2020: Hemoglobin 11.1; Magnesium 1.9; Platelets 117 03/31/2020: ALT 21; BUN 16; Creatinine, Ser 1.02; Potassium 4.6; Sodium 135  Recent Lipid Panel No results found for: CHOL, TRIG, HDL, CHOLHDL, VLDL, LDLCALC, LDLDIRECT  Physical Exam:    VS:  BP  (!) 158/58   Pulse 76   Ht 5\' 7"  (1.702 m)   Wt 221 lb (100.2 kg)   SpO2 99%   BMI 34.61 kg/m     Wt Readings from Last 3 Encounters:  05/13/20 221 lb (100.2 kg)  03/31/20 229 lb 9.6 oz (104.1 kg)  10/15/16 217 lb (98.4 kg)     GEN: Patient is in no acute distress HEENT: Normal NECK: No JVD; No carotid bruits LYMPHATICS: No lymphadenopathy CARDIAC: Hear sounds regular, 2/6 systolic murmur at the apex. RESPIRATORY:  Clear to auscultation without rales, wheezing or rhonchi  ABDOMEN: Soft, non-tender, non-distended MUSCULOSKELETAL:  No edema; No deformity  SKIN: Warm and dry NEUROLOGIC:  Alert and oriented x 3 PSYCHIATRIC:  Normal affect   Signed, 10/17/16, MD  05/13/2020 10:06 AM    [facility]

## 2020-08-20 ENCOUNTER — Ambulatory Visit: Payer: Medicare PPO | Admitting: Cardiology

## 2021-09-16 IMAGING — DX DG CHEST 2V
2 series · 2 of 2 positions shown · non-contrast
Comparison: None.

CLINICAL DATA: 64-year-old female with syncope.

EXAM:
CHEST - 2 VIEW

[chest pa]
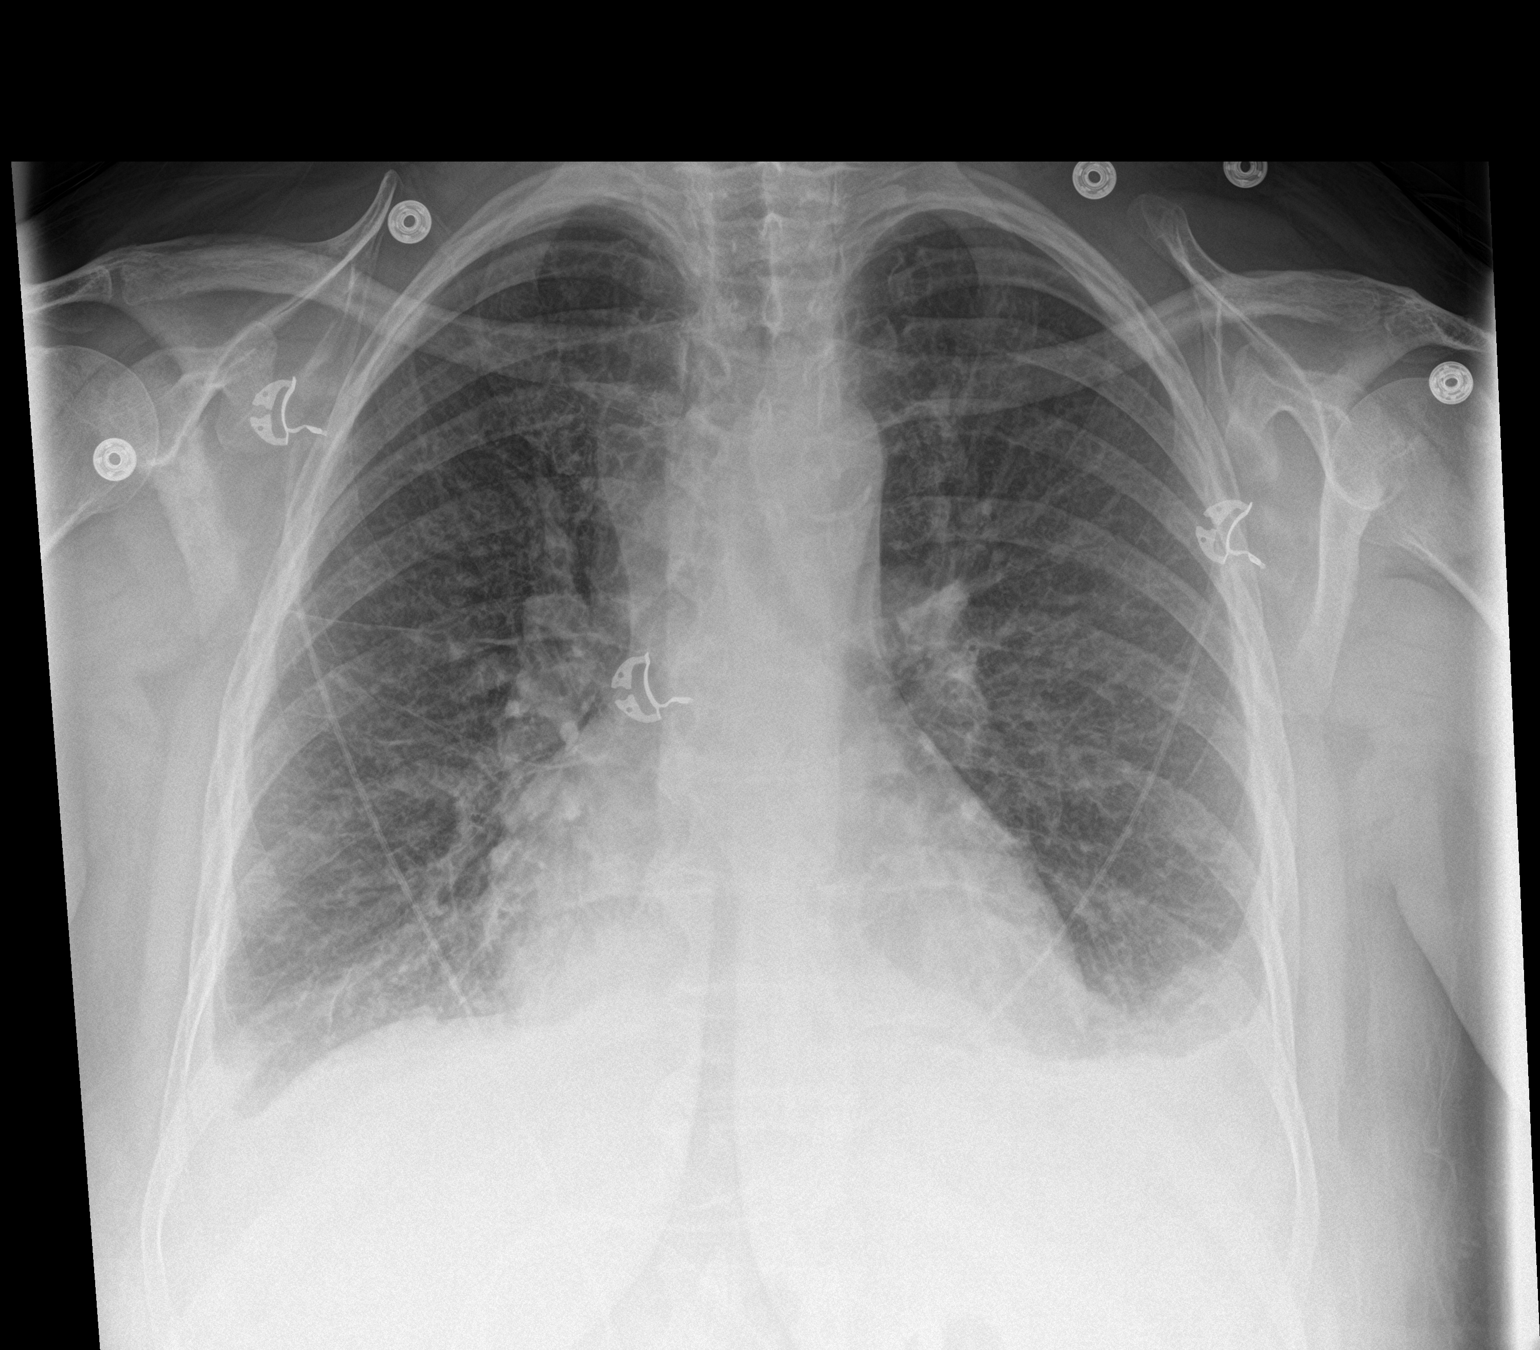

[chest lat]
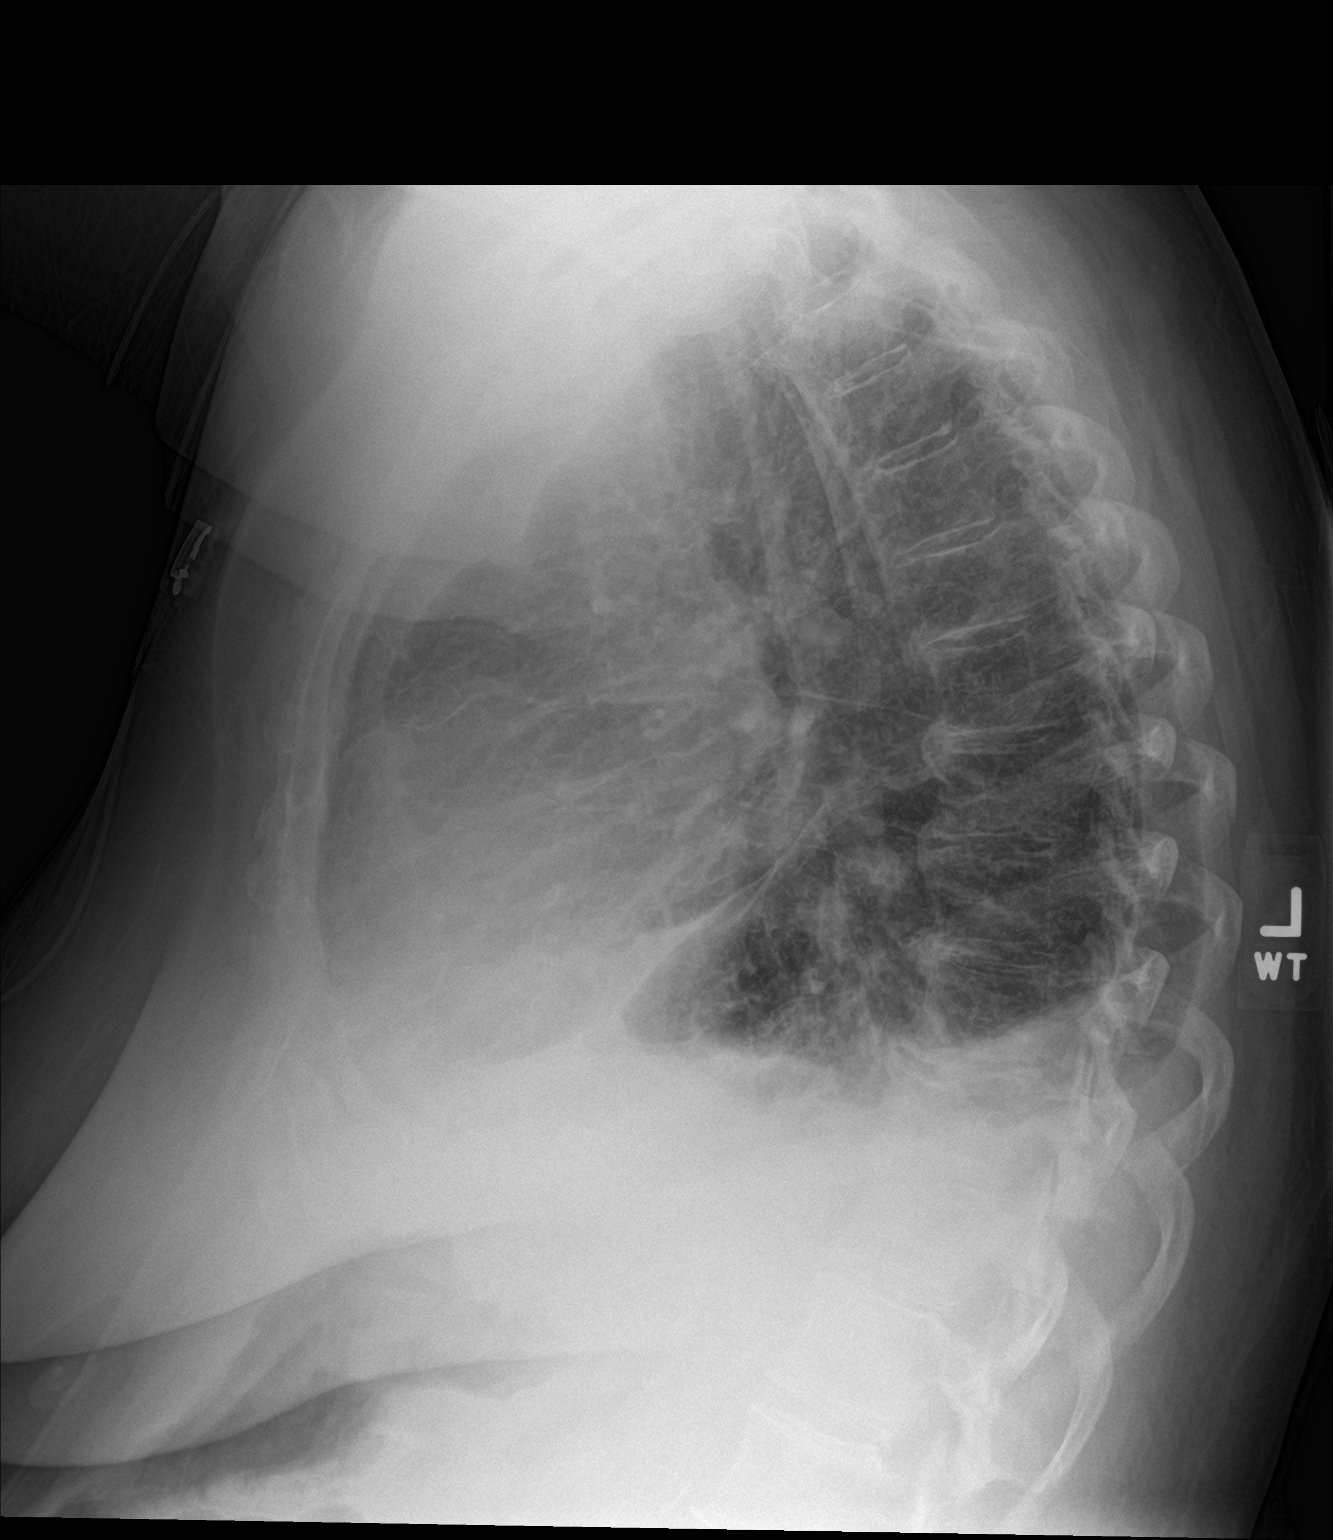

[2 of 2 positions shown; findings below may reference images not displayed]

FINDINGS: Small bilateral pleural effusions, left greater right with
associated bibasilar atelectasis. Pneumonia is not excluded.
Clinical correlation is recommended. There is diffuse interstitial
and interlobular septal prominence consistent with edema. There is
no pneumothorax. Borderline cardiomegaly. Atherosclerotic
calcification of the aorta. No acute osseous pathology.
IMPRESSION: Findings of CHF and small bilateral pleural effusions. Pneumonia is
not excluded. Clinical correlation is recommended.
